# Patient Record
Sex: Female | Born: 1950 | Race: White | Hispanic: No | Marital: Married | Smoking: Never smoker
Health system: Southern US, Community
[De-identification: ages and names within clinical notes are randomized; demographics above are authoritative.]

## PROBLEM LIST (undated history)

## (undated) DIAGNOSIS — C4370 Malignant melanoma of unspecified lower limb, including hip: Secondary | ICD-10-CM

## (undated) DIAGNOSIS — E785 Hyperlipidemia, unspecified: Secondary | ICD-10-CM

## (undated) DIAGNOSIS — I10 Essential (primary) hypertension: Secondary | ICD-10-CM

## (undated) DIAGNOSIS — M1990 Unspecified osteoarthritis, unspecified site: Secondary | ICD-10-CM

## (undated) DIAGNOSIS — K5790 Diverticulosis of intestine, part unspecified, without perforation or abscess without bleeding: Secondary | ICD-10-CM

## (undated) DIAGNOSIS — K5792 Diverticulitis of intestine, part unspecified, without perforation or abscess without bleeding: Secondary | ICD-10-CM

## (undated) HISTORY — PX: OTHER SURGICAL HISTORY: SHX169

## (undated) HISTORY — DX: Diverticulitis of intestine, part unspecified, without perforation or abscess without bleeding: K57.92

## (undated) HISTORY — PX: MELANOMA EXCISION: SHX5266

## (undated) HISTORY — PX: TOOTH EXTRACTION: SUR596

## (undated) HISTORY — DX: Essential (primary) hypertension: I10

## (undated) HISTORY — DX: Hyperlipidemia, unspecified: E78.5

## (undated) HISTORY — DX: Malignant melanoma of unspecified lower limb, including hip: C43.70

## (undated) HISTORY — DX: Diverticulosis of intestine, part unspecified, without perforation or abscess without bleeding: K57.90

---

## 1989-03-04 HISTORY — PX: KNEE ARTHROSCOPY: SUR90

## 1996-07-04 DIAGNOSIS — C4370 Malignant melanoma of unspecified lower limb, including hip: Secondary | ICD-10-CM

## 1996-07-04 HISTORY — DX: Malignant melanoma of unspecified lower limb, including hip: C43.70

## 2011-12-21 LAB — HM COLONOSCOPY

## 2013-03-27 LAB — HM DEXA SCAN

## 2014-03-13 LAB — HM PAP SMEAR: HM Pap smear: NORMAL

## 2014-03-13 LAB — HM MAMMOGRAPHY: HM Mammogram: NORMAL

## 2014-08-08 ENCOUNTER — Encounter: Payer: Self-pay | Admitting: Internal Medicine

## 2014-08-08 ENCOUNTER — Ambulatory Visit (INDEPENDENT_AMBULATORY_CARE_PROVIDER_SITE_OTHER): Payer: BLUE CROSS/BLUE SHIELD | Admitting: Internal Medicine

## 2014-08-08 ENCOUNTER — Encounter (INDEPENDENT_AMBULATORY_CARE_PROVIDER_SITE_OTHER): Payer: Self-pay

## 2014-08-08 VITALS — BP 138/82 | HR 71 | Temp 97.5°F | Ht 65.5 in | Wt 233.5 lb

## 2014-08-08 DIAGNOSIS — K5790 Diverticulosis of intestine, part unspecified, without perforation or abscess without bleeding: Secondary | ICD-10-CM | POA: Insufficient documentation

## 2014-08-08 DIAGNOSIS — K5730 Diverticulosis of large intestine without perforation or abscess without bleeding: Secondary | ICD-10-CM

## 2014-08-08 DIAGNOSIS — E785 Hyperlipidemia, unspecified: Secondary | ICD-10-CM

## 2014-08-08 DIAGNOSIS — Z85820 Personal history of malignant melanoma of skin: Secondary | ICD-10-CM | POA: Insufficient documentation

## 2014-08-08 DIAGNOSIS — C4371 Malignant melanoma of right lower limb, including hip: Secondary | ICD-10-CM

## 2014-08-08 DIAGNOSIS — I10 Essential (primary) hypertension: Secondary | ICD-10-CM

## 2014-08-08 DIAGNOSIS — Z0000 Encounter for general adult medical examination without abnormal findings: Secondary | ICD-10-CM | POA: Insufficient documentation

## 2014-08-08 LAB — COMPREHENSIVE METABOLIC PANEL
ALT: 20 U/L (ref 0–35)
AST: 30 U/L (ref 0–37)
Albumin: 4 g/dL (ref 3.5–5.2)
Alkaline Phosphatase: 76 U/L (ref 39–117)
BILIRUBIN TOTAL: 0.5 mg/dL (ref 0.2–1.2)
BUN: 12 mg/dL (ref 6–23)
CHLORIDE: 102 meq/L (ref 96–112)
CO2: 30 meq/L (ref 19–32)
CREATININE: 0.78 mg/dL (ref 0.40–1.20)
Calcium: 10.1 mg/dL (ref 8.4–10.5)
GFR: 79.45 mL/min (ref >60.00)
Glucose, Bld: 98 mg/dL (ref 70–99)
Potassium: 4.2 mEq/L (ref 3.5–5.1)
Sodium: 138 mEq/L (ref 135–145)
Total Protein: 7.3 g/dL (ref 6.0–8.3)

## 2014-08-08 LAB — CBC WITH DIFFERENTIAL/PLATELET
BASOS ABS: 0.1 10*3/uL (ref 0.0–0.1)
Basophils Relative: 1.1 % (ref 0.0–3.0)
Eosinophils Absolute: 0.4 10*3/uL (ref 0.0–0.7)
Eosinophils Relative: 5.7 % — ABNORMAL HIGH (ref 0.0–5.0)
HCT: 41 % (ref 36.0–46.0)
HEMOGLOBIN: 13.7 g/dL (ref 12.0–15.0)
LYMPHS ABS: 2.1 10*3/uL (ref 0.7–4.0)
Lymphocytes Relative: 31 % (ref 12.0–46.0)
MCHC: 33.3 g/dL (ref 30.0–36.0)
MCV: 88.6 fl (ref 78.0–100.0)
MONO ABS: 0.5 10*3/uL (ref 0.1–1.0)
Monocytes Relative: 7.4 % (ref 3.0–12.0)
Neutro Abs: 3.8 10*3/uL (ref 1.4–7.7)
Neutrophils Relative %: 54.8 % (ref 43.0–77.0)
Platelets: 250 10*3/uL (ref 150.0–400.0)
RBC: 4.63 Mil/uL (ref 3.87–5.11)
RDW: 14.4 % (ref 11.5–15.5)
WBC: 6.9 10*3/uL (ref 4.0–10.5)

## 2014-08-08 LAB — LIPID PANEL
Cholesterol: 177 mg/dL (ref 0–200)
HDL: 52.4 mg/dL (ref >39.00)
LDL CALC: 104 mg/dL — AB (ref 0–99)
NONHDL: 124.6
Total CHOL/HDL Ratio: 3
Triglycerides: 105 mg/dL (ref 0.0–149.0)
VLDL: 21 mg/dL (ref 0.0–40.0)

## 2014-08-08 LAB — T4, FREE: FREE T4: 0.95 ng/dL (ref 0.60–1.60)

## 2014-08-08 MED ORDER — PRAVASTATIN SODIUM 10 MG PO TABS: 10.0000 mg | ORAL_TABLET | Freq: Every day | ORAL | Status: DC

## 2014-08-08 MED ORDER — HYDROCHLOROTHIAZIDE 12.5 MG PO CAPS: 12.5000 mg | ORAL_CAPSULE | Freq: Every day | ORAL | Status: DC

## 2014-08-08 NOTE — Assessment & Plan Note (Signed)
Discussed primary prevention Will continue statin

## 2014-08-08 NOTE — Assessment & Plan Note (Signed)
Discussed improved bowel regimen Will try miralax

## 2014-08-08 NOTE — Patient Instructions (Addendum)
Try daily miralax for your bowels. If you need more, you can add senna-s 2 tabs daily Stop either the krill oil or fish oil You can take just one multivitamin instead of all the vitamins you take.  DASH Eating Plan DASH stands for "Dietary Approaches to Stop Hypertension." The DASH eating plan is a healthy eating plan that has been shown to reduce high blood pressure (hypertension). Additional health benefits may include reducing the risk of type 2 diabetes mellitus, heart disease, and stroke. The DASH eating plan may also help with weight loss. WHAT DO I NEED TO KNOW ABOUT THE DASH EATING PLAN? For the DASH eating plan, you will follow these general guidelines:  Choose foods with a percent daily value for sodium of less than 5% (as listed on the food label).  Use salt-free seasonings or herbs instead of table salt or sea salt.  Check with your health care provider or pharmacist before using salt substitutes.  Eat lower-sodium products, often labeled as "lower sodium" or "no salt added."  Eat fresh foods.  Eat more vegetables, fruits, and low-fat dairy products.  Choose whole grains. Look for the word "whole" as the first word in the ingredient list.  Choose fish and skinless chicken or [place] more often than red meat. Limit fish, poultry, and meat to 6 oz (170 g) each day.  Limit sweets, desserts, sugars, and sugary drinks.  Choose heart-healthy fats.  Limit cheese to 1 oz (28 g) per day.  Eat more home-cooked food and less restaurant, buffet, and fast food.  Limit fried foods.  Cook foods using methods other than frying.  Limit canned vegetables. If you do use them, rinse them well to decrease the sodium.  When eating at a restaurant, ask that your food be prepared with less salt, or no salt if possible. WHAT FOODS CAN I EAT? Seek help from a dietitian for individual calorie needs. Grains Whole grain or whole wheat bread. Brown rice. Whole grain or whole wheat pasta.  Quinoa, bulgur, and whole grain cereals. Low-sodium cereals. Corn or whole wheat flour tortillas. Whole grain cornbread. Whole grain crackers. Low-sodium crackers. Vegetables Fresh or frozen vegetables (raw, steamed, roasted, or grilled). Low-sodium or reduced-sodium tomato and vegetable juices. Low-sodium or reduced-sodium tomato sauce and paste. Low-sodium or reduced-sodium canned vegetables.  Fruits All fresh, canned (in natural juice), or frozen fruits. Meat and Other Protein Products Ground beef (85% or leaner), grass-fed beef, or beef trimmed of fat. Skinless chicken or [place]. Ground chicken or [place]. Pork trimmed of fat. All fish and seafood. Eggs. Dried beans, peas, or lentils. Unsalted nuts and seeds. Unsalted canned beans. Dairy Low-fat dairy products, such as skim or 1% milk, 2% or reduced-fat cheeses, low-fat ricotta or cottage cheese, or plain low-fat yogurt. Low-sodium or reduced-sodium cheeses. Fats and Oils Tub margarines without trans fats. Light or reduced-fat mayonnaise and salad dressings (reduced sodium). Avocado. Safflower, olive, or canola oils. Natural peanut or almond butter. Other Unsalted popcorn and pretzels. The items listed above may not be a complete list of recommended foods or beverages. Contact your dietitian for more options. WHAT FOODS ARE NOT RECOMMENDED? Grains White bread. White pasta. White rice. Refined cornbread. Bagels and croissants. Crackers that contain trans fat. Vegetables Creamed or fried vegetables. Vegetables in a cheese sauce. Regular canned vegetables. Regular canned tomato sauce and paste. Regular tomato and vegetable juices. Fruits Dried fruits. Canned fruit in light or heavy syrup. Fruit juice. Meat and Other Protein Products Fatty cuts of meat.  Ribs, chicken wings, bacon, sausage, bologna, salami, chitterlings, fatback, hot dogs, bratwurst, and packaged luncheon meats. Salted nuts and seeds. Canned beans with salt. Dairy Whole or 2%  milk, cream, half-and-half, and cream cheese. Whole-fat or sweetened yogurt. Full-fat cheeses or blue cheese. Nondairy creamers and whipped toppings. Processed cheese, cheese spreads, or cheese curds. Condiments Onion and garlic salt, seasoned salt, table salt, and sea salt. Canned and packaged gravies. Worcestershire sauce. Tartar sauce. Barbecue sauce. Teriyaki sauce. Soy sauce, including reduced sodium. Steak sauce. Fish sauce. Oyster sauce. Cocktail sauce. Horseradish. Ketchup and mustard. Meat flavorings and tenderizers. Bouillon cubes. Hot sauce. Tabasco sauce. Marinades. Taco seasonings. Relishes. Fats and Oils Butter, stick margarine, lard, shortening, ghee, and bacon fat. Coconut, palm kernel, or palm oils. Regular salad dressings. Other Pickles and olives. Salted popcorn and pretzels. The items listed above may not be a complete list of foods and beverages to avoid. Contact your dietitian for more information. WHERE CAN I FIND MORE INFORMATION? National Heart, Lung, and Blood Institute: travelstabloid.com Document Released: 06/09/2011 Document Revised: 11/04/2013 Document Reviewed: 04/24/2013 [facility] Patient Information 2015 [place], [place]. This information is not intended to replace advice given to you by your health care provider. Make sure you discuss any questions you have with your health care provider. Exercise to Lose Weight Exercise and a healthy diet may help you lose weight. Your doctor may suggest specific exercises. EXERCISE IDEAS AND TIPS  Choose low-cost things you enjoy doing, such as walking, bicycling, or exercising to workout videos.  Take stairs instead of the elevator.  Walk during your lunch break.  Park your car further away from work or school.  Go to a gym or an exercise class.  Start with 5 to 10 minutes of exercise each day. Build up to 30 minutes of exercise 4 to 6 days a week.  Wear shoes with good support and  comfortable clothes.  Stretch before and after working out.  Work out until you breathe harder and your heart beats faster.  Drink extra water when you exercise.  Do not do so much that you hurt yourself, feel dizzy, or get very short of breath. Exercises that burn about 150 calories:  Running 1  miles in 15 minutes.  Playing volleyball for 45 to 60 minutes.  Washing and waxing a car for 45 to 60 minutes.  Playing touch football for 45 minutes.  Walking 1  miles in 35 minutes.  Pushing a stroller 1  miles in 30 minutes.  Playing basketball for 30 minutes.  Raking leaves for 30 minutes.  Bicycling 5 miles in 30 minutes.  Walking 2 miles in 30 minutes.  Dancing for 30 minutes.  Shoveling snow for 15 minutes.  Swimming laps for 20 minutes.  Walking up stairs for 15 minutes.  Bicycling 4 miles in 15 minutes.  Gardening for 30 to 45 minutes.  Jumping rope for 15 minutes.  Washing windows or floors for 45 to 60 minutes. Document Released: 07/23/2010 Document Revised: 09/12/2011 Document Reviewed: 07/23/2010 [facility] Patient Information 2015 [place], [place]. This information is not intended to replace advice given to you by your health care provider. Make sure you discuss any questions you have with your health care provider.

## 2014-08-08 NOTE — Progress Notes (Signed)
Pre visit review using our clinic review tool, if applicable. No additional management support is needed unless otherwise documented below in the visit note. 

## 2014-08-08 NOTE — Assessment & Plan Note (Signed)
UTD with imms and cancer screening

## 2014-08-08 NOTE — Addendum Note (Signed)
Addended by: Viviana Simpler I on: 08/08/2014 10:58 AM   Modules accepted: Orders

## 2014-08-08 NOTE — Assessment & Plan Note (Signed)
BP Readings from Last 3 Encounters:  08/08/14 138/82   Seems to be controlled Will continue the med

## 2014-08-08 NOTE — Progress Notes (Signed)
Subjective:    Patient ID: Cindy Mendoza, female    DOB: [birth year], 64 y.o.   MRN: [id number]  HPI Here with husband to establish Changing due to insurance  Blood pressure up some over the past 5-6 years On HCTZ since then No headaches, chest pain, SOB Doesn't do any exercise  On statin No problems with that No myalgias on this one---had trouble with simvastatin  Chronic constipation Uses stool softener but not very effective  No current outpatient prescriptions on file prior to visit.   No current facility-administered medications on file prior to visit.    No Known Allergies  Past Medical History  Diagnosis Date  . Diverticulitis   . Melanoma of foot 1998  . Hyperlipidemia   . Hypertension     Past Surgical History  Procedure Laterality Date  . Tooth extraction    . Melanoma excision    . Melanoma removal    . Knee arthroscopy Right 1990's    Family History  Problem Relation Age of Onset  . Arthritis Mother   . Cancer Father     lung cancer  . Heart disease Sister     History   Social History  . Marital Status: Married    Spouse Name: N/A    Number of Children: 1  . Years of Education: N/A   Occupational History  . 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 Smoking status: Never Smoker   . Smokeless tobacco: Never Used  . Alcohol Use: No  . Drug Use: No  . Sexual Activity: Yes   Other Topics Concern  . Not on file   Social History Narrative   1 daughter   Review of Systems  Constitutional: Negative for fatigue and unexpected weight change.       No exercise Wears seat belt  HENT: Positive for dental problem. Negative for hearing loss and tinnitus.        Mild TMJ symptoms at times Regular with dentist  Eyes: Negative for visual disturbance.  Respiratory: Negative for cough, chest tightness and shortness of breath.   Cardiovascular: Positive for leg swelling. Negative  for chest pain and palpitations.       Slight swelling if up a long time  Gastrointestinal: Positive for abdominal pain. Negative for nausea, vomiting, constipation and blood in stool.       Some diverticulosis pain  Endocrine: Negative for polydipsia and polyuria.  Genitourinary: Negative for difficulty urinating and dyspareunia.       UTIs in past   Musculoskeletal: Negative for back pain, joint swelling and arthralgias.  Skin: Negative for rash.  Allergic/Immunologic: Negative for environmental allergies and immunocompromised state.  Neurological: Negative for dizziness, syncope, weakness, light-headedness, numbness and headaches.  Psychiatric/Behavioral: Negative for sleep disturbance and dysphoric mood. The patient is not nervous/anxious.        Snores but no apnea       Objective:   Physical Exam  Constitutional: She is oriented to person, place, and time. She appears well-developed and well-nourished. No distress.  HENT:  Head: Normocephalic and atraumatic.  Right Ear: External ear normal.  Left Ear: External ear normal.  Mouth/Throat: Oropharynx is clear and moist. No oropharyngeal exudate.  Eyes: Conjunctivae and EOM are normal. Pupils are equal, round, and reactive to light.  Neck: Normal range of motion. Neck supple. No thyromegaly present.  Cardiovascular: Normal rate, regular rhythm, normal heart sounds  and intact distal pulses.  Exam reveals no gallop.   No murmur heard. Pulmonary/Chest: Effort normal and breath sounds normal. No respiratory distress. She has no wheezes. She has no rales.  Abdominal: Soft. There is no tenderness.  Musculoskeletal: She exhibits no edema or tenderness.  Lymphadenopathy:    She has no cervical adenopathy.  Neurological: She is alert and oriented to person, place, and time.  Skin: No rash noted. No erythema.  Psychiatric: She has a normal mood and affect. Her behavior is normal.          Assessment & Plan:

## 2014-08-08 NOTE — Assessment & Plan Note (Signed)
Keeps up with yearly derm evaluations

## 2014-08-12 ENCOUNTER — Telehealth: Payer: Self-pay | Admitting: Internal Medicine

## 2014-08-12 NOTE — Telephone Encounter (Signed)
emmi emailed °

## 2015-09-10 ENCOUNTER — Ambulatory Visit (INDEPENDENT_AMBULATORY_CARE_PROVIDER_SITE_OTHER): Payer: BLUE CROSS/BLUE SHIELD | Admitting: Internal Medicine

## 2015-09-10 ENCOUNTER — Encounter: Payer: Self-pay | Admitting: Internal Medicine

## 2015-09-10 VITALS — BP 118/90 | HR 84 | Temp 98.0°F | Ht 66.0 in | Wt 236.5 lb

## 2015-09-10 DIAGNOSIS — E785 Hyperlipidemia, unspecified: Secondary | ICD-10-CM

## 2015-09-10 DIAGNOSIS — K5730 Diverticulosis of large intestine without perforation or abscess without bleeding: Secondary | ICD-10-CM

## 2015-09-10 DIAGNOSIS — Z0000 Encounter for general adult medical examination without abnormal findings: Secondary | ICD-10-CM | POA: Diagnosis not present

## 2015-09-10 DIAGNOSIS — I10 Essential (primary) hypertension: Secondary | ICD-10-CM | POA: Diagnosis not present

## 2015-09-10 LAB — CBC WITH DIFFERENTIAL/PLATELET
BASOS ABS: 0.1 10*3/uL (ref 0.0–0.1)
Basophils Relative: 0.8 % (ref 0.0–3.0)
EOS ABS: 0.4 10*3/uL (ref 0.0–0.7)
EOS PCT: 5.1 % — AB (ref 0.0–5.0)
HCT: 41.2 % (ref 36.0–46.0)
HEMOGLOBIN: 13.8 g/dL (ref 12.0–15.0)
LYMPHS ABS: 2.7 10*3/uL (ref 0.7–4.0)
Lymphocytes Relative: 32.4 % (ref 12.0–46.0)
MCHC: 33.5 g/dL (ref 30.0–36.0)
MCV: 87.8 fl (ref 78.0–100.0)
MONO ABS: 0.6 10*3/uL (ref 0.1–1.0)
Monocytes Relative: 6.8 % (ref 3.0–12.0)
NEUTROS PCT: 54.9 % (ref 43.0–77.0)
Neutro Abs: 4.5 10*3/uL (ref 1.4–7.7)
Platelets: 262 10*3/uL (ref 150.0–400.0)
RBC: 4.69 Mil/uL (ref 3.87–5.11)
RDW: 14.7 % (ref 11.5–15.5)
WBC: 8.2 10*3/uL (ref 4.0–10.5)

## 2015-09-10 LAB — COMPREHENSIVE METABOLIC PANEL
ALBUMIN: 4.3 g/dL (ref 3.5–5.2)
ALK PHOS: 73 U/L (ref 39–117)
ALT: 24 U/L (ref 0–35)
AST: 35 U/L (ref 0–37)
BILIRUBIN TOTAL: 0.7 mg/dL (ref 0.2–1.2)
BUN: 11 mg/dL (ref 6–23)
CO2: 30 mEq/L (ref 19–32)
CREATININE: 0.81 mg/dL (ref 0.40–1.20)
Calcium: 9.8 mg/dL (ref 8.4–10.5)
Chloride: 100 mEq/L (ref 96–112)
GFR: 75.8 mL/min (ref >60.00)
GLUCOSE: 95 mg/dL (ref 70–99)
POTASSIUM: 3.6 meq/L (ref 3.5–5.1)
SODIUM: 139 meq/L (ref 135–145)
TOTAL PROTEIN: 7.9 g/dL (ref 6.0–8.3)

## 2015-09-10 LAB — LIPID PANEL
CHOLESTEROL: 183 mg/dL (ref 0–200)
HDL: 54.4 mg/dL (ref >39.00)
LDL CALC: 110 mg/dL — AB (ref 0–99)
NonHDL: 128.35
TRIGLYCERIDES: 93 mg/dL (ref 0.0–149.0)
Total CHOL/HDL Ratio: 3
VLDL: 18.6 mg/dL (ref 0.0–40.0)

## 2015-09-10 MED ORDER — PRAVASTATIN SODIUM 10 MG PO TABS: 10.0000 mg | ORAL_TABLET | Freq: Every day | ORAL | Status: DC

## 2015-09-10 MED ORDER — AMOXICILLIN-POT CLAVULANATE 875-125 MG PO TABS: 1.0000 | ORAL_TABLET | Freq: Two times a day (BID) | ORAL | Status: DC

## 2015-09-10 MED ORDER — HYDROCHLOROTHIAZIDE 12.5 MG PO CAPS: 12.5000 mg | ORAL_CAPSULE | Freq: Every day | ORAL | Status: DC

## 2015-09-10 NOTE — Assessment & Plan Note (Signed)
Recurrent spells of diverticulitis Will give Rx for augmentin to use if recurs

## 2015-09-10 NOTE — Progress Notes (Signed)
Pre visit review using our clinic review tool, if applicable. No additional management support is needed unless otherwise documented below in the visit note. 

## 2015-09-10 NOTE — Assessment & Plan Note (Signed)
BP Readings from Last 3 Encounters:  09/10/15 118/90  08/08/14 138/82   Good control No change needed

## 2015-09-10 NOTE — Assessment & Plan Note (Signed)
Doing well on primary prevention Will continue

## 2015-09-10 NOTE — Patient Instructions (Addendum)
Please set up your screening mammogram this September  DASH Eating Plan DASH stands for "Dietary Approaches to Stop Hypertension." The DASH eating plan is a healthy eating plan that has been shown to reduce high blood pressure (hypertension). Additional health benefits may include reducing the risk of type 2 diabetes mellitus, heart disease, and stroke. The DASH eating plan may also help with weight loss. WHAT DO I NEED TO KNOW ABOUT THE DASH EATING PLAN? For the DASH eating plan, you will follow these general guidelines:  Choose foods with a percent daily value for sodium of less than 5% (as listed on the food label).  Use salt-free seasonings or herbs instead of table salt or sea salt.  Check with your health care provider or pharmacist before using salt substitutes.  Eat lower-sodium products, often labeled as "lower sodium" or "no salt added."  Eat fresh foods.  Eat more vegetables, fruits, and low-fat dairy products.  Choose whole grains. Look for the word "whole" as the first word in the ingredient list.  Choose fish and skinless chicken or [place] more often than red meat. Limit fish, poultry, and meat to 6 oz (170 g) each day.  Limit sweets, desserts, sugars, and sugary drinks.  Choose heart-healthy fats.  Limit cheese to 1 oz (28 g) per day.  Eat more home-cooked food and less restaurant, buffet, and fast food.  Limit fried foods.  Cook foods using methods other than frying.  Limit canned vegetables. If you do use them, rinse them well to decrease the sodium.  When eating at a restaurant, ask that your food be prepared with less salt, or no salt if possible. WHAT FOODS CAN I EAT? Seek help from a dietitian for individual calorie needs. Grains Whole grain or whole wheat bread. Brown rice. Whole grain or whole wheat pasta. Quinoa, bulgur, and whole grain cereals. Low-sodium cereals. Corn or whole wheat flour tortillas. Whole grain cornbread. Whole grain crackers.  Low-sodium crackers. Vegetables Fresh or frozen vegetables (raw, steamed, roasted, or grilled). Low-sodium or reduced-sodium tomato and vegetable juices. Low-sodium or reduced-sodium tomato sauce and paste. Low-sodium or reduced-sodium canned vegetables.  Fruits All fresh, canned (in natural juice), or frozen fruits. Meat and Other Protein Products Ground beef (85% or leaner), grass-fed beef, or beef trimmed of fat. Skinless chicken or [place]. Ground chicken or [place]. Pork trimmed of fat. All fish and seafood. Eggs. Dried beans, peas, or lentils. Unsalted nuts and seeds. Unsalted canned beans. Dairy Low-fat dairy products, such as skim or 1% milk, 2% or reduced-fat cheeses, low-fat ricotta or cottage cheese, or plain low-fat yogurt. Low-sodium or reduced-sodium cheeses. Fats and Oils Tub margarines without trans fats. Light or reduced-fat mayonnaise and salad dressings (reduced sodium). Avocado. Safflower, olive, or canola oils. Natural peanut or almond butter. Other Unsalted popcorn and pretzels. The items listed above may not be a complete list of recommended foods or beverages. Contact your dietitian for more options. WHAT FOODS ARE NOT RECOMMENDED? Grains White bread. White pasta. White rice. Refined cornbread. Bagels and croissants. Crackers that contain trans fat. Vegetables Creamed or fried vegetables. Vegetables in a cheese sauce. Regular canned vegetables. Regular canned tomato sauce and paste. Regular tomato and vegetable juices. Fruits Dried fruits. Canned fruit in light or heavy syrup. Fruit juice. Meat and Other Protein Products Fatty cuts of meat. Ribs, chicken wings, bacon, sausage, bologna, salami, chitterlings, fatback, hot dogs, bratwurst, and packaged luncheon meats. Salted nuts and seeds. Canned beans with salt. Dairy Whole or 2% milk, cream,  half-and-half, and cream cheese. Whole-fat or sweetened yogurt. Full-fat cheeses or blue cheese. Nondairy creamers and whipped  toppings. Processed cheese, cheese spreads, or cheese curds. Condiments Onion and garlic salt, seasoned salt, table salt, and sea salt. Canned and packaged gravies. Worcestershire sauce. Tartar sauce. Barbecue sauce. Teriyaki sauce. Soy sauce, including reduced sodium. Steak sauce. Fish sauce. Oyster sauce. Cocktail sauce. Horseradish. Ketchup and mustard. Meat flavorings and tenderizers. Bouillon cubes. Hot sauce. Tabasco sauce. Marinades. Taco seasonings. Relishes. Fats and Oils Butter, stick margarine, lard, shortening, ghee, and bacon fat. Coconut, palm kernel, or palm oils. Regular salad dressings. Other Pickles and olives. Salted popcorn and pretzels. The items listed above may not be a complete list of foods and beverages to avoid. Contact your dietitian for more information. WHERE CAN I FIND MORE INFORMATION? National Heart, Lung, and Blood Institute: travelstabloid.com   This information is not intended to replace advice given to you by your health care provider. Make sure you discuss any questions you have with your health care provider.   Document Released: 06/09/2011 Document Revised: 07/11/2014 Document Reviewed: 04/24/2013 Elsevier Interactive Patient Education [employer].

## 2015-09-10 NOTE — Assessment & Plan Note (Addendum)
Healthy but needs to work on fitness mammo due 9/17 One more pap due by age 65--she may go to gyn UTD on imms Colon due 2023

## 2015-09-10 NOTE — Progress Notes (Signed)
Subjective:    Patient ID: Cindy Mendoza, female    DOB: 11/25/1950, 65 y.o.   MRN: [id number]  HPI Here for physical  Bad cold in January--then late January had another spell with diverticulitis Had left over medication--cipro/flagyl (a few doses) She did recover with soft diet Usually goes regularly with metamucil Wonders about keeping safety medication--just in case  No other concerns Not doing much for healthy lifestyle--counseled  Current Outpatient Prescriptions on File Prior to Visit  Medication Sig Dispense Refill  . Ascorbic Acid (VITAMIN C PO) Take 1 capsule by mouth.    . Calcium Carbonate-Vit D-Min (CALCIUM 1200 PO) Take 1 tablet by mouth daily.    . Flaxseed, Linseed, (FLAX SEED OIL) 1000 MG CAPS Take 1 capsule by mouth daily.    . hydrochlorothiazide (MICROZIDE) 12.5 MG capsule Take 1 capsule (12.5 mg total) by mouth daily. 90 capsule 3  . Omega-3 Fatty Acids (FISH OIL) 1000 MG CAPS Take 2 capsules by mouth daily.    . pravastatin (PRAVACHOL) 10 MG tablet Take 1 tablet (10 mg total) by mouth daily. 90 tablet 3  . Vitamin D, Cholecalciferol, 1000 UNITS CAPS Take 1 capsule by mouth daily.     No current facility-administered medications on file prior to visit.    Allergies  Allergen Reactions  . Simvastatin Other (See Comments)    myalgias    Past Medical History  Diagnosis Date  . Diverticulitis   . Melanoma of foot ([place]) 1998  . Hyperlipidemia   . Hypertension   . Diverticulosis     Past Surgical History  Procedure Laterality Date  . Tooth extraction    . Melanoma excision    . Melanoma removal    . Knee arthroscopy Right 1990's    Family History  Problem Relation Age of Onset  . Arthritis Mother   . Cancer Father     lung cancer  . Heart disease Sister     Social History   Social History  . Marital Status: Married    Spouse Name: N/A  . Number of Children: 1  . Years of Education: N/A   Occupational History  . 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 Smoking status: Never Smoker   . Smokeless tobacco: Never Used  . Alcohol Use: No  . Drug Use: No  . Sexual Activity: Yes   Other Topics Concern  . Not on file   Social History Narrative   1 daughter   Review of Systems  Constitutional: Negative for fatigue and unexpected weight change.       Wears seat belt  HENT: Negative for dental problem, hearing loss and tinnitus.        Keeps up with dentist Did have swelling at left mandible--better on its own   Eyes: Negative for visual disturbance.       Did have flashing lights in right eye---was seen and not retinal detachment  Respiratory: Negative for cough, chest tightness and shortness of breath.   Cardiovascular: Negative for chest pain and palpitations.       Slight edema if on feet all day  Gastrointestinal: Negative for nausea, vomiting, abdominal pain and blood in stool.       No heartburn  Endocrine: Negative for polydipsia and polyuria.  Genitourinary: Negative for dysuria, hematuria and dyspareunia.       Gyn just retired---deciding about seeing someone else  Musculoskeletal:  Negative for back pain, joint swelling and arthralgias.       AM stiffness that resolves quickly  Skin: Negative for rash.       Sees derm yearly  Allergic/Immunologic: Negative for environmental allergies and immunocompromised state.  Neurological: Negative for dizziness, syncope, weakness, light-headedness and headaches.  Hematological: Negative for adenopathy. Does not bruise/bleed easily.  Psychiatric/Behavioral: Negative for sleep disturbance and dysphoric mood. The patient is not nervous/anxious.        Objective:   Physical Exam  Constitutional: She is oriented to person, place, and time. She appears well-developed and well-nourished. No distress.  HENT:  Head: Normocephalic and atraumatic.  Right Ear: External ear normal.  Left Ear: External ear  normal.  Mouth/Throat: Oropharynx is clear and moist. No oropharyngeal exudate.  Eyes: Conjunctivae are normal. Pupils are equal, round, and reactive to light.  Neck: Normal range of motion. Neck supple. No thyromegaly present.  Cardiovascular: Normal rate, regular rhythm, normal heart sounds and intact distal pulses.  Exam reveals no gallop.   No murmur heard. Pulmonary/Chest: Effort normal and breath sounds normal. No respiratory distress. She has no wheezes. She has no rales.  Abdominal: Soft. There is no tenderness.  Musculoskeletal: She exhibits no edema or tenderness.  Lymphadenopathy:    She has no cervical adenopathy.  Neurological: She is alert and oriented to person, place, and time.  Skin: No rash noted. No erythema.  Psychiatric: She has a normal mood and affect. Her behavior is normal.          Assessment & Plan:

## 2016-07-15 ENCOUNTER — Telehealth: Payer: Self-pay | Admitting: Internal Medicine

## 2016-07-15 NOTE — Telephone Encounter (Signed)
Pt called to cancel her 2018 cpe. Her ins does not cover preventative this year but she will call when she gets medicare later this year.

## 2016-09-13 ENCOUNTER — Encounter: Payer: BLUE CROSS/BLUE SHIELD | Admitting: Internal Medicine

## 2017-04-19 DIAGNOSIS — L578 Other skin changes due to chronic exposure to nonionizing radiation: Secondary | ICD-10-CM | POA: Diagnosis not present

## 2017-04-19 DIAGNOSIS — Z85820 Personal history of malignant melanoma of skin: Secondary | ICD-10-CM | POA: Diagnosis not present

## 2017-04-19 DIAGNOSIS — Z86018 Personal history of other benign neoplasm: Secondary | ICD-10-CM | POA: Diagnosis not present

## 2017-04-19 DIAGNOSIS — L570 Actinic keratosis: Secondary | ICD-10-CM | POA: Diagnosis not present

## 2017-04-19 DIAGNOSIS — L821 Other seborrheic keratosis: Secondary | ICD-10-CM | POA: Diagnosis not present

## 2017-04-24 DIAGNOSIS — H2510 Age-related nuclear cataract, unspecified eye: Secondary | ICD-10-CM | POA: Diagnosis not present

## 2017-04-24 DIAGNOSIS — H43819 Vitreous degeneration, unspecified eye: Secondary | ICD-10-CM | POA: Diagnosis not present

## 2017-04-24 DIAGNOSIS — Z23 Encounter for immunization: Secondary | ICD-10-CM | POA: Diagnosis not present

## 2017-04-27 DIAGNOSIS — Z1231 Encounter for screening mammogram for malignant neoplasm of breast: Secondary | ICD-10-CM | POA: Diagnosis not present

## 2017-04-27 LAB — HM MAMMOGRAPHY

## 2017-05-15 DIAGNOSIS — Z1211 Encounter for screening for malignant neoplasm of colon: Secondary | ICD-10-CM | POA: Diagnosis not present

## 2017-05-15 DIAGNOSIS — K5730 Diverticulosis of large intestine without perforation or abscess without bleeding: Secondary | ICD-10-CM | POA: Diagnosis not present

## 2017-05-15 LAB — HM COLONOSCOPY

## 2017-09-21 ENCOUNTER — Encounter: Payer: Self-pay | Admitting: Internal Medicine

## 2017-09-21 ENCOUNTER — Ambulatory Visit (INDEPENDENT_AMBULATORY_CARE_PROVIDER_SITE_OTHER): Payer: Medicare Other | Admitting: Internal Medicine

## 2017-09-21 VITALS — BP 128/76 | HR 75 | Temp 98.3°F | Ht 66.5 in | Wt 249.5 lb

## 2017-09-21 DIAGNOSIS — E2839 Other primary ovarian failure: Secondary | ICD-10-CM

## 2017-09-21 DIAGNOSIS — Z7189 Other specified counseling: Secondary | ICD-10-CM | POA: Insufficient documentation

## 2017-09-21 DIAGNOSIS — Z23 Encounter for immunization: Secondary | ICD-10-CM

## 2017-09-21 DIAGNOSIS — Z85820 Personal history of malignant melanoma of skin: Secondary | ICD-10-CM | POA: Diagnosis not present

## 2017-09-21 DIAGNOSIS — Z0000 Encounter for general adult medical examination without abnormal findings: Secondary | ICD-10-CM

## 2017-09-21 DIAGNOSIS — I10 Essential (primary) hypertension: Secondary | ICD-10-CM

## 2017-09-21 DIAGNOSIS — E785 Hyperlipidemia, unspecified: Secondary | ICD-10-CM

## 2017-09-21 MED ORDER — PRAVASTATIN SODIUM 10 MG PO TABS: 10.0000 mg | ORAL_TABLET | Freq: Every day | ORAL | 3 refills | Status: DC

## 2017-09-21 MED ORDER — PRAVASTATIN SODIUM 10 MG PO TABS: 10.0000 mg | ORAL_TABLET | Freq: Every day | ORAL | 3 refills | Status: AC

## 2017-09-21 MED ORDER — HYDROCHLOROTHIAZIDE 12.5 MG PO TABS: 12.5000 mg | ORAL_TABLET | Freq: Every day | ORAL | 3 refills | Status: AC

## 2017-09-21 NOTE — Assessment & Plan Note (Signed)
See social history 

## 2017-09-21 NOTE — Progress Notes (Signed)
Hearing Screening   125Hz  250Hz  500Hz  1000Hz  2000Hz  3000Hz  4000Hz  6000Hz  8000Hz   Right ear:   40 0 40  40    Left ear:   40 40 40  40    Vision Screening Comments: 04/24/17 with Dr. Marvel Plan

## 2017-09-21 NOTE — Assessment & Plan Note (Signed)
BP Readings from Last 3 Encounters:  09/21/17 128/76  09/10/15 118/90  08/08/14 138/82   Good control Due for labs

## 2017-09-21 NOTE — Assessment & Plan Note (Signed)
BMI ~74 plus metabolic syndrome DASH info Urged her to start [employer]

## 2017-09-21 NOTE — Addendum Note (Signed)
Addended by: Tammi Sou on: 09/21/2017 04:37 PM   Modules accepted: Orders

## 2017-09-21 NOTE — Assessment & Plan Note (Signed)
Keeps up with the dermatologist

## 2017-09-21 NOTE — Assessment & Plan Note (Addendum)
I have personally reviewed the Medicare Annual Wellness questionnaire and have noted  1. The patient's medical and social history  2. Their use of alcohol, tobacco or illicit drugs  3. Their current medications and supplements  4. The patient's functional ability including ADL's, fall risks, home safety risks and hearing or visual              impairment.  5. Diet and physical activities  6. Evidence for depression or mood disorders  The patients weight, height, BMI and visual acuity have been recorded in the chart  I have made referrals, counseling and provided education to the patient based review of the above and I have provided the pt with a written personalized care plan for preventive services.   I have provided you with a copy of your personalized plan for preventive services. Please take the time to review along with your updated medication list.  prevnar today Flu vaccine yearly Just had colon and mammogram Will recheck DEXA---done over 5 years ago Discussed fitness  EKG--- sinus at 72. Normal axis and intervals No ischemic changes and appears normal to me (I don't agree with the computer of old AWMI)

## 2017-09-21 NOTE — Assessment & Plan Note (Signed)
Doing fine with primary prevention 

## 2017-09-21 NOTE — Progress Notes (Signed)
Subjective:    Patient ID: Cindy Mendoza, female    DOB: 17-Dec-1950, 67 y.o.   MRN: [id number]  HPI Here for Welcome to Medicare visit and follow up of chronic health conditions Reviewed form and advanced directives Reviewed other doctors No exercise---takes care of 103 year old grandchildren (twins) No tobacco or alcohol No falls No depression or anhedonia Vision is fine No functional hearing problems Independent with instrumental ADLs No sig memory issues  Continues on HCTZ No chest pain or SOB No dizziness or syncope No edema Non palpitations Some headaches--seems to be sinus  Still on statin for primary prevention No myalgias or GI problems  Current Outpatient Medications on File Prior to Visit  Medication Sig Dispense Refill  . Ascorbic Acid (VITAMIN C PO) Take 1 capsule by mouth.    . Calcium Carbonate-Vit D-Min (CALCIUM 1200 PO) Take 1 tablet by mouth daily.    . Flaxseed, Linseed, (FLAX SEED OIL) 1000 MG CAPS Take 1 capsule by mouth daily.    . hydrochlorothiazide (MICROZIDE) 12.5 MG capsule Take 1 capsule (12.5 mg total) by mouth daily. 90 capsule 3  . Omega-3 Fatty Acids (FISH OIL) 1000 MG CAPS Take 2 capsules by mouth daily.    . pravastatin (PRAVACHOL) 10 MG tablet Take 1 tablet (10 mg total) by mouth daily. 90 tablet 3  . psyllium (METAMUCIL) 58.6 % powder Take 1 packet by mouth daily.    . Vitamin D, Cholecalciferol, 1000 UNITS CAPS Take 1 capsule by mouth daily.     No current facility-administered medications on file prior to visit.     Allergies  Allergen Reactions  . Simvastatin Other (See Comments)    myalgias    Past Medical History:  Diagnosis Date  . Diverticulitis   . Diverticulosis   . Hyperlipidemia   . Hypertension   . Melanoma of foot ([place]) 1998    Past Surgical History:  Procedure Laterality Date  . KNEE ARTHROSCOPY Right 1990's  . MELANOMA EXCISION    . Melanoma removal    . TOOTH EXTRACTION      Family History  Problem  Relation Age of Onset  . Arthritis Mother   . Cancer Father        lung cancer  . Heart disease Sister     Social History   Socioeconomic History  . Marital status: Married    Spouse name: Not on file  . Number of children: 1  . Years of education: Not on file  . Highest education level: Not on file  Occupational History  . 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 Financial resource strain: Not on file  . Food insecurity:    Worry: Not on file    Inability: Not on file  . Transportation needs:    Medical: Not on file    Non-medical: Not on file  Tobacco Use  . Smoking status: Never Smoker  . Smokeless tobacco: Never Used  Substance and Sexual Activity  . Alcohol use: No  . Drug use: No  . Sexual activity: Yes  Lifestyle  . Physical activity:    Days per week: Not on file    Minutes per session: Not on file  . Stress: Not on file  Relationships  . Social connections:    Talks on phone: Not on file    Gets together: Not on file    Attends religious service: Not on file  Active member of club or organization: Not on file    Attends meetings of clubs or organizations: Not on file    Relationship status: Not on file  . Intimate partner violence:    Fear of current or ex partner: Not on file    Emotionally abused: Not on file    Physically abused: Not on file    Forced sexual activity: Not on file  Other Topics Concern  . 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 No blood No urinary problems Mild joint pain--especially with bad weather. Occasional aleve Keeps up with derm--history of  melanoma Occasional heartburn--tums helps. Once a week usually. No dysphagia    Objective:   Physical Exam  Constitutional: She is oriented to person, place, and time. She appears well-developed. No distress.  HENT:  Mouth/Throat: Oropharynx is clear and moist. No oropharyngeal exudate.  Neck: No thyromegaly present.  Cardiovascular: Normal rate, regular rhythm, normal heart sounds and intact distal pulses. Exam reveals no gallop.  No murmur heard. Pulmonary/Chest: Effort normal and breath sounds normal. No respiratory distress. She has no wheezes. She has no rales.  Abdominal: Soft. There is no tenderness.  Musculoskeletal: She exhibits no edema or tenderness.  Lymphadenopathy:    She has no cervical adenopathy.  Neurological: She is alert and oriented to person, place, and time.  President--- "Daisy Floro, Obama, Clinton--?" [phone] [id number] Recall 3/3  Skin: No rash noted. No erythema.  Psychiatric: She has a normal mood and affect. Her behavior is normal.          Assessment & Plan:

## 2017-09-21 NOTE — Patient Instructions (Signed)
DASH Eating Plan DASH stands for "Dietary Approaches to Stop Hypertension." The DASH eating plan is a healthy eating plan that has been shown to reduce high blood pressure (hypertension). It may also reduce your risk for type 2 diabetes, heart disease, and stroke. The DASH eating plan may also help with weight loss. What are tips for following this plan? General guidelines  Avoid eating more than 2,300 mg (milligrams) of salt (sodium) a day. If you have hypertension, you may need to reduce your sodium intake to 1,500 mg a day.  Limit alcohol intake to no more than 1 drink a day for nonpregnant women and 2 drinks a day for men. One drink equals 12 oz of beer, 5 oz of wine, or 1 oz of hard liquor.  Work with your health care provider to maintain a healthy body weight or to lose weight. Ask what an ideal weight is for you.  Get at least 30 minutes of exercise that causes your heart to beat faster (aerobic exercise) most days of the week. Activities may include walking, swimming, or biking.  Work with your health care provider or diet and nutrition specialist (dietitian) to adjust your eating plan to your individual calorie needs. Reading food labels  Check food labels for the amount of sodium per serving. Choose foods with less than 5 percent of the Daily Value of sodium. Generally, foods with less than 300 mg of sodium per serving fit into this eating plan.  To find whole grains, look for the word "whole" as the first word in the ingredient list. Shopping  Buy products labeled as "low-sodium" or "no salt added."  Buy fresh foods. Avoid canned foods and premade or frozen meals. Cooking  Avoid adding salt when cooking. Use salt-free seasonings or herbs instead of table salt or sea salt. Check with your health care provider or pharmacist before using salt substitutes.  Do not fry foods. Cook foods using healthy methods such as baking, boiling, grilling, and broiling instead.  Cook with  heart-healthy oils, such as olive, canola, soybean, or sunflower oil. Meal planning   Eat a balanced diet that includes: ? 5 or more servings of fruits and vegetables each day. At each meal, try to fill half of your plate with fruits and vegetables. ? Up to 6-8 servings of whole grains each day. ? Less than 6 oz of lean meat, poultry, or fish each day. A 3-oz serving of meat is about the same size as a deck of cards. One egg equals 1 oz. ? 2 servings of low-fat dairy each day. ? A serving of nuts, seeds, or beans 5 times each week. ? Heart-healthy fats. Healthy fats called Omega-3 fatty acids are found in foods such as flaxseeds and coldwater fish, like sardines, salmon, and mackerel.  Limit how much you eat of the following: ? Canned or prepackaged foods. ? Food that is high in trans fat, such as fried foods. ? Food that is high in saturated fat, such as fatty meat. ? Sweets, desserts, sugary drinks, and other foods with added sugar. ? Full-fat dairy products.  Do not salt foods before eating.  Try to eat at least 2 vegetarian meals each week.  Eat more home-cooked food and less restaurant, buffet, and fast food.  When eating at a restaurant, ask that your food be prepared with less salt or no salt, if possible. What foods are recommended? The items listed may not be a complete list. Talk with your dietitian about what   dietary choices are best for you. Grains Whole-grain or whole-wheat bread. Whole-grain or whole-wheat pasta. Brown rice. Oatmeal. Quinoa. Bulgur. Whole-grain and low-sodium cereals. Pita bread. Low-fat, low-sodium crackers. Whole-wheat flour tortillas. Vegetables Fresh or frozen vegetables (raw, steamed, roasted, or grilled). Low-sodium or reduced-sodium tomato and vegetable juice. Low-sodium or reduced-sodium tomato sauce and tomato paste. Low-sodium or reduced-sodium canned vegetables. Fruits All fresh, dried, or frozen fruit. Canned fruit in natural juice (without  added sugar). Meat and other protein foods Skinless chicken or turkey. Ground chicken or turkey. Pork with fat trimmed off. Fish and seafood. Egg whites. Dried beans, peas, or lentils. Unsalted nuts, nut butters, and seeds. Unsalted canned beans. Lean cuts of beef with fat trimmed off. Low-sodium, lean deli meat. Dairy Low-fat (1%) or fat-free (skim) milk. Fat-free, low-fat, or reduced-fat cheeses. Nonfat, low-sodium ricotta or cottage cheese. Low-fat or nonfat yogurt. Low-fat, low-sodium cheese. Fats and oils Soft margarine without trans fats. Vegetable oil. Low-fat, reduced-fat, or light mayonnaise and salad dressings (reduced-sodium). Canola, safflower, olive, soybean, and sunflower oils. Avocado. Seasoning and other foods Herbs. Spices. Seasoning mixes without salt. Unsalted popcorn and pretzels. Fat-free sweets. What foods are not recommended? The items listed may not be a complete list. Talk with your dietitian about what dietary choices are best for you. Grains Baked goods made with fat, such as croissants, muffins, or some breads. Dry pasta or rice meal packs. Vegetables Creamed or fried vegetables. Vegetables in a cheese sauce. Regular canned vegetables (not low-sodium or reduced-sodium). Regular canned tomato sauce and paste (not low-sodium or reduced-sodium). Regular tomato and vegetable juice (not low-sodium or reduced-sodium). Pickles. Olives. Fruits Canned fruit in a light or heavy syrup. Fried fruit. Fruit in cream or butter sauce. Meat and other protein foods Fatty cuts of meat. Ribs. Fried meat. Bacon. Sausage. Bologna and other processed lunch meats. Salami. Fatback. Hotdogs. Bratwurst. Salted nuts and seeds. Canned beans with added salt. Canned or smoked fish. Whole eggs or egg yolks. Chicken or turkey with skin. Dairy Whole or 2% milk, cream, and half-and-half. Whole or full-fat cream cheese. Whole-fat or sweetened yogurt. Full-fat cheese. Nondairy creamers. Whipped toppings.  Processed cheese and cheese spreads. Fats and oils Butter. Stick margarine. Lard. Shortening. Ghee. Bacon fat. Tropical oils, such as coconut, palm kernel, or palm oil. Seasoning and other foods Salted popcorn and pretzels. Onion salt, garlic salt, seasoned salt, table salt, and sea salt. Worcestershire sauce. Tartar sauce. Barbecue sauce. Teriyaki sauce. Soy sauce, including reduced-sodium. Steak sauce. Canned and packaged gravies. Fish sauce. Oyster sauce. Cocktail sauce. Horseradish that you find on the shelf. Ketchup. Mustard. Meat flavorings and tenderizers. Bouillon cubes. Hot sauce and Tabasco sauce. Premade or packaged marinades. Premade or packaged taco seasonings. Relishes. Regular salad dressings. Where to find more information:  National Heart, Lung, and Blood Institute: www.nhlbi.nih.gov  American Heart Association: www.heart.org Summary  The DASH eating plan is a healthy eating plan that has been shown to reduce high blood pressure (hypertension). It may also reduce your risk for type 2 diabetes, heart disease, and stroke.  With the DASH eating plan, you should limit salt (sodium) intake to 2,300 mg a day. If you have hypertension, you may need to reduce your sodium intake to 1,500 mg a day.  When on the DASH eating plan, aim to eat more fresh fruits and vegetables, whole grains, lean proteins, low-fat dairy, and heart-healthy fats.  Work with your health care provider or diet and nutrition specialist (dietitian) to adjust your eating plan to your individual   calorie needs. This information is not intended to replace advice given to you by your health care provider. Make sure you discuss any questions you have with your health care provider. Document Released: 06/09/2011 Document Revised: 06/13/2016 Document Reviewed: 06/13/2016 Elsevier Interactive Patient Education  2018 Elsevier Inc.  

## 2017-09-22 LAB — COMPREHENSIVE METABOLIC PANEL
ALBUMIN: 4.1 g/dL (ref 3.5–5.2)
ALK PHOS: 80 U/L (ref 39–117)
ALT: 21 U/L (ref 0–35)
AST: 33 U/L (ref 0–37)
BILIRUBIN TOTAL: 0.7 mg/dL (ref 0.2–1.2)
BUN: 12 mg/dL (ref 6–23)
CO2: 29 mEq/L (ref 19–32)
CREATININE: 0.76 mg/dL (ref 0.40–1.20)
Calcium: 9.8 mg/dL (ref 8.4–10.5)
Chloride: 101 mEq/L (ref 96–112)
GFR: 81.06 mL/min (ref >60.00)
Glucose, Bld: 94 mg/dL (ref 70–99)
POTASSIUM: 4.1 meq/L (ref 3.5–5.1)
SODIUM: 139 meq/L (ref 135–145)
TOTAL PROTEIN: 8 g/dL (ref 6.0–8.3)

## 2017-09-22 LAB — LIPID PANEL
Cholesterol: 183 mg/dL (ref 0–200)
HDL: 57.8 mg/dL (ref >39.00)
LDL Cholesterol: 109 mg/dL — ABNORMAL HIGH (ref 0–99)
NonHDL: 125.37
Total CHOL/HDL Ratio: 3
Triglycerides: 84 mg/dL (ref 0.0–149.0)
VLDL: 16.8 mg/dL (ref 0.0–40.0)

## 2017-09-22 LAB — CBC
HCT: 40.6 % (ref 36.0–46.0)
HEMOGLOBIN: 13.4 g/dL (ref 12.0–15.0)
MCHC: 33.1 g/dL (ref 30.0–36.0)
MCV: 88.5 fl (ref 78.0–100.0)
PLATELETS: 290 10*3/uL (ref 150.0–400.0)
RBC: 4.59 Mil/uL (ref 3.87–5.11)
RDW: 14.8 % (ref 11.5–15.5)
WBC: 8.5 10*3/uL (ref 4.0–10.5)

## 2017-10-19 ENCOUNTER — Other Ambulatory Visit: Payer: Medicare Other

## 2018-04-21 DIAGNOSIS — Z23 Encounter for immunization: Secondary | ICD-10-CM | POA: Diagnosis not present

## 2018-04-25 ENCOUNTER — Encounter: Payer: Self-pay | Admitting: Obstetrics & Gynecology

## 2018-04-25 ENCOUNTER — Other Ambulatory Visit (HOSPITAL_COMMUNITY)
Admission: RE | Admit: 2018-04-25 | Discharge: 2018-04-25 | Disposition: A | Discharge status: Home / Self Care | Payer: Medicare Other | Source: Ambulatory Visit | Attending: Obstetrics & Gynecology | Admitting: Obstetrics & Gynecology

## 2018-04-25 ENCOUNTER — Ambulatory Visit (INDEPENDENT_AMBULATORY_CARE_PROVIDER_SITE_OTHER): Payer: Medicare Other | Admitting: Obstetrics & Gynecology

## 2018-04-25 ENCOUNTER — Other Ambulatory Visit: Payer: Self-pay | Admitting: Obstetrics & Gynecology

## 2018-04-25 VITALS — BP 138/80 | Ht 66.0 in | Wt 254.0 lb

## 2018-04-25 DIAGNOSIS — Z1239 Encounter for other screening for malignant neoplasm of breast: Secondary | ICD-10-CM

## 2018-04-25 DIAGNOSIS — Z124 Encounter for screening for malignant neoplasm of cervix: Secondary | ICD-10-CM | POA: Insufficient documentation

## 2018-04-25 DIAGNOSIS — Z1211 Encounter for screening for malignant neoplasm of colon: Secondary | ICD-10-CM

## 2018-04-25 DIAGNOSIS — Z13820 Encounter for screening for osteoporosis: Secondary | ICD-10-CM

## 2018-04-25 DIAGNOSIS — Z0000 Encounter for general adult medical examination without abnormal findings: Secondary | ICD-10-CM

## 2018-04-25 NOTE — Patient Instructions (Addendum)
PAP every three years Mammogram every year    Call [phone] to schedule at [facility]    Also schedule DEXA bone scan Colonoscopy every 10 years Labs yearly (with PCP)

## 2018-04-25 NOTE — Progress Notes (Signed)
HPI:      Ms. Saadiya Wilfong is a 67 y.o. G1P1001 who LMP was in the past, she presents today for her annual examination.  The patient has no complaints today. The patient is sexually active. Herlast pap: approximate date 2016 and was normal and last mammogram: approximate date 2018 and was normal.  The patient does perform self breast exams.  There is no notable family history of breast or ovarian cancer in her family. The patient is not taking hormone replacement therapy. Patient denies post-menopausal vaginal bleeding.   The patient has regular exercise: yes. The patient denies current symptoms of depression.    GYN Hx: Last Colonoscopy:1 year ago. Normal.  Last DEXA: 5 years ago.    PMHx: Past Medical History:  Diagnosis Date  . Diverticulitis   . Diverticulosis   . Hyperlipidemia   . Hypertension   . Melanoma of foot ([place]) 1998   Past Surgical History:  Procedure Laterality Date  . KNEE ARTHROSCOPY Right 1990's  . MELANOMA EXCISION    . Melanoma removal    . TOOTH EXTRACTION     Family History  Problem Relation Age of Onset  . Arthritis Mother   . Cancer Father        lung cancer  . Heart disease Sister    Social History   Tobacco Use  . Smoking status: Never Smoker  . Smokeless tobacco: Never Used  Substance Use Topics  . Alcohol use: No  . Drug use: No    Current Outpatient Medications:  .  Ascorbic Acid (VITAMIN C PO), Take 1 capsule by mouth., Disp: , Rfl:  .  Calcium Carbonate-Vit D-Min (CALCIUM 1200 PO), Take 1 tablet by mouth daily., Disp: , Rfl:  .  Flaxseed, Linseed, (FLAX SEED OIL) 1000 MG CAPS, Take 1 capsule by mouth daily., Disp: , Rfl:  .  hydrochlorothiazide (HYDRODIURIL) 12.5 MG tablet, Take 1 tablet (12.5 mg total) by mouth daily., Disp: 90 tablet, Rfl: 3 .  Omega-3 Fatty Acids (FISH OIL) 1000 MG CAPS, Take 2 capsules by mouth daily., Disp: , Rfl:  .  pravastatin (PRAVACHOL) 10 MG tablet, Take 1 tablet (10 mg total) by mouth daily., Disp: 90  tablet, Rfl: 3 .  psyllium (METAMUCIL) 58.6 % powder, Take 1 packet by mouth daily., Disp: , Rfl:  .  Vitamin D, Cholecalciferol, 1000 UNITS CAPS, Take 1 capsule by mouth daily., Disp: , Rfl:  Allergies: Simvastatin  Review of Systems  Constitutional: Negative for chills, fever and malaise/fatigue.  HENT: Negative for congestion, sinus pain and sore throat.   Eyes: Negative for blurred vision and pain.  Respiratory: Negative for cough and wheezing.   Cardiovascular: Negative for chest pain and leg swelling.  Gastrointestinal: Negative for abdominal pain, constipation, diarrhea, heartburn, nausea and vomiting.  Genitourinary: Negative for dysuria, frequency, hematuria and urgency.  Musculoskeletal: Negative for back pain, joint pain, myalgias and neck pain.  Skin: Negative for itching and rash.  Neurological: Negative for dizziness, tremors and weakness.  Endo/Heme/Allergies: Does not bruise/bleed easily.  Psychiatric/Behavioral: Negative for depression. The patient is not nervous/anxious and does not have insomnia.     Objective: BP 138/80   Ht 5\' 6"  (1.676 m)   Wt 254 lb (115.2 kg)   BMI 41.00 kg/m   Filed Weights   04/25/18 1003  Weight: 254 lb (115.2 kg)   Body mass index is 41 kg/m. Physical Exam  Constitutional: She is oriented to person, place, and time. She appears well-developed and well-nourished.  No distress.  Genitourinary: Rectum normal, vagina normal and uterus normal. Pelvic exam was performed with patient supine. There is no rash or lesion on the right labia. There is no rash or lesion on the left labia. Vagina exhibits no lesion. No bleeding in the vagina. Right adnexum does not display mass and does not display tenderness. Left adnexum does not display mass and does not display tenderness. Cervix does not exhibit motion tenderness, lesion, friability or polyp.   Uterus is mobile and midaxial. Uterus is not enlarged or exhibiting a mass.  HENT:  Head:  Normocephalic and atraumatic. Head is without laceration.  Right Ear: Hearing normal.  Left Ear: Hearing normal.  Nose: No epistaxis.  No foreign bodies.  Mouth/Throat: Uvula is midline, oropharynx is clear and moist and mucous membranes are normal.  Eyes: Pupils are equal, round, and reactive to light.  Neck: Normal range of motion. Neck supple. No thyromegaly present.  Cardiovascular: Normal rate and regular rhythm. Exam reveals no gallop and no friction rub.  No murmur heard. Pulmonary/Chest: Effort normal and breath sounds normal. No respiratory distress. She has no wheezes. Right breast exhibits no mass, no skin change and no tenderness. Left breast exhibits no mass, no skin change and no tenderness.  Abdominal: Soft. Bowel sounds are normal. She exhibits no distension. There is no tenderness. There is no rebound.  Musculoskeletal: Normal range of motion.  Neurological: She is alert and oriented to person, place, and time. No cranial nerve deficit.  Skin: Skin is warm and dry.  Psychiatric: She has a normal mood and affect. Judgment normal.  Vitals reviewed.  Assessment: Annual Exam 1. Annual physical exam   2. Screening for cervical cancer   3. Screening for breast cancer   4. Screen for colon cancer   5. Screening for osteoporosis    Plan:            1.  Cervical Screening-  Pap smear done today  2. Breast screening- Exam annually and mammogram scheduled  3. Colonoscopy every 10 years, Hemoccult testing after age 4  4. Labs managed by PCP  5. Counseling for hormonal therapy: none  6. Flu shot done  7. Desires DEXA, last one 2014    F/U  Return in about 1 year (around 04/26/2019) for Annual.  Barnett Applebaum, MD, [facility], [facility] 04/25/2018  12:21 PM

## 2018-04-27 ENCOUNTER — Other Ambulatory Visit: Payer: Self-pay | Admitting: Obstetrics & Gynecology

## 2018-04-27 ENCOUNTER — Ambulatory Visit: Payer: Self-pay | Admitting: Obstetrics & Gynecology

## 2018-04-27 DIAGNOSIS — M8580 Other specified disorders of bone density and structure, unspecified site: Secondary | ICD-10-CM | POA: Insufficient documentation

## 2018-04-27 DIAGNOSIS — E2839 Other primary ovarian failure: Secondary | ICD-10-CM | POA: Insufficient documentation

## 2018-04-30 ENCOUNTER — Other Ambulatory Visit: Payer: Self-pay | Admitting: Obstetrics & Gynecology

## 2018-04-30 DIAGNOSIS — Z1239 Encounter for other screening for malignant neoplasm of breast: Secondary | ICD-10-CM

## 2018-04-30 LAB — CYTOLOGY - PAP
Diagnosis: NEGATIVE
HPV: NOT DETECTED

## 2018-04-30 NOTE — Progress Notes (Signed)
I looked in the log book, I did not sign one out. So I don't think I gave her a flu shot.

## 2018-05-01 NOTE — Progress Notes (Signed)
No I did not give the flu shot

## 2018-05-01 NOTE — Progress Notes (Signed)
I did not give the flu shot

## 2018-05-03 ENCOUNTER — Other Ambulatory Visit: Payer: Self-pay | Admitting: Obstetrics & Gynecology

## 2018-05-03 DIAGNOSIS — H2510 Age-related nuclear cataract, unspecified eye: Secondary | ICD-10-CM | POA: Diagnosis not present

## 2018-05-03 DIAGNOSIS — Z1231 Encounter for screening mammogram for malignant neoplasm of breast: Secondary | ICD-10-CM

## 2018-05-10 ENCOUNTER — Other Ambulatory Visit: Payer: Medicare Other

## 2018-06-15 NOTE — Telephone Encounter (Signed)
This encounter was created in error - please disregard.

## 2018-07-05 DIAGNOSIS — Z1231 Encounter for screening mammogram for malignant neoplasm of breast: Secondary | ICD-10-CM | POA: Diagnosis not present

## 2018-07-12 ENCOUNTER — Ambulatory Visit
Admission: RE | Admit: 2018-07-12 | Discharge: 2018-07-12 | Disposition: A | Discharge status: Home / Self Care | Payer: Medicare Other | Source: Ambulatory Visit | Attending: Obstetrics & Gynecology | Admitting: Obstetrics & Gynecology

## 2018-07-12 DIAGNOSIS — E2839 Other primary ovarian failure: Secondary | ICD-10-CM | POA: Diagnosis not present

## 2018-07-12 DIAGNOSIS — M8588 Other specified disorders of bone density and structure, other site: Secondary | ICD-10-CM | POA: Diagnosis not present

## 2018-07-24 DIAGNOSIS — Z86018 Personal history of other benign neoplasm: Secondary | ICD-10-CM | POA: Diagnosis not present

## 2018-07-24 DIAGNOSIS — Z872 Personal history of diseases of the skin and subcutaneous tissue: Secondary | ICD-10-CM | POA: Diagnosis not present

## 2018-07-24 DIAGNOSIS — L578 Other skin changes due to chronic exposure to nonionizing radiation: Secondary | ICD-10-CM | POA: Diagnosis not present

## 2018-07-24 DIAGNOSIS — Z1283 Encounter for screening for malignant neoplasm of skin: Secondary | ICD-10-CM | POA: Diagnosis not present

## 2018-07-24 DIAGNOSIS — Z85820 Personal history of malignant melanoma of skin: Secondary | ICD-10-CM | POA: Diagnosis not present

## 2018-08-09 ENCOUNTER — Telehealth: Payer: Self-pay

## 2018-08-09 NOTE — Telephone Encounter (Signed)
-----   Message from Gae Dry, MD sent at 08/08/2018 11:45 AM EST ----- Regarding: MMG Received notice she has not received MMG yet as ordered at her Annual. Please check and encourage her to do this, and document conversation.

## 2018-08-09 NOTE — Telephone Encounter (Signed)
Left message to advise pt to get her mammo

## 2018-09-20 ENCOUNTER — Other Ambulatory Visit: Payer: Medicare Other

## 2018-09-24 DIAGNOSIS — E782 Mixed hyperlipidemia: Secondary | ICD-10-CM | POA: Diagnosis not present

## 2018-09-24 DIAGNOSIS — I10 Essential (primary) hypertension: Secondary | ICD-10-CM | POA: Diagnosis not present

## 2018-09-26 ENCOUNTER — Encounter: Payer: Medicare Other | Admitting: Internal Medicine

## 2018-09-28 DIAGNOSIS — M8580 Other specified disorders of bone density and structure, unspecified site: Secondary | ICD-10-CM | POA: Diagnosis not present

## 2018-09-28 DIAGNOSIS — Z79899 Other long term (current) drug therapy: Secondary | ICD-10-CM | POA: Diagnosis not present

## 2018-09-28 DIAGNOSIS — E782 Mixed hyperlipidemia: Secondary | ICD-10-CM | POA: Diagnosis not present

## 2018-09-28 DIAGNOSIS — I10 Essential (primary) hypertension: Secondary | ICD-10-CM | POA: Diagnosis not present

## 2018-09-28 DIAGNOSIS — Z0000 Encounter for general adult medical examination without abnormal findings: Secondary | ICD-10-CM | POA: Diagnosis not present

## 2019-02-01 ENCOUNTER — Other Ambulatory Visit: Payer: Self-pay

## 2019-02-19 ENCOUNTER — Telehealth: Payer: Self-pay | Admitting: Obstetrics & Gynecology

## 2019-02-19 NOTE — Telephone Encounter (Signed)
-----   Message from Gae Dry, MD sent at 02/16/2019  8:32 AM EDT ----- Regarding: Sch Annual in [employer], also encourage MMG anytime prior  Mammogram    Call [phone] to schedule at [facility]

## 2019-02-19 NOTE — Telephone Encounter (Signed)
Called and left generic message for patient to call back to be schedule

## 2019-02-21 NOTE — Telephone Encounter (Signed)
Patient has Medicare, covered every 2 years, will call next to schedule.

## 2019-03-27 DIAGNOSIS — Z23 Encounter for immunization: Secondary | ICD-10-CM | POA: Diagnosis not present

## 2019-05-07 DIAGNOSIS — H2510 Age-related nuclear cataract, unspecified eye: Secondary | ICD-10-CM | POA: Diagnosis not present

## 2019-05-14 ENCOUNTER — Other Ambulatory Visit: Payer: Self-pay | Admitting: Obstetrics & Gynecology

## 2019-05-14 DIAGNOSIS — Z1231 Encounter for screening mammogram for malignant neoplasm of breast: Secondary | ICD-10-CM

## 2019-05-21 ENCOUNTER — Telehealth: Payer: Self-pay | Admitting: Obstetrics & Gynecology

## 2019-05-21 ENCOUNTER — Other Ambulatory Visit: Payer: Self-pay | Admitting: Student

## 2019-05-21 ENCOUNTER — Other Ambulatory Visit: Payer: Self-pay | Admitting: Obstetrics & Gynecology

## 2019-05-21 DIAGNOSIS — Z1231 Encounter for screening mammogram for malignant neoplasm of breast: Secondary | ICD-10-CM

## 2019-05-21 NOTE — Telephone Encounter (Signed)
Called and left voice mail for patient to call back to be schedule °

## 2019-05-21 NOTE — Telephone Encounter (Signed)
-----   Message from Gae Dry, MD sent at 05/20/2019  7:57 AM EST ----- Regarding: Annual needed, plx schedule

## 2019-05-29 ENCOUNTER — Other Ambulatory Visit: Payer: Self-pay

## 2019-07-19 ENCOUNTER — Ambulatory Visit
Admission: RE | Admit: 2019-07-19 | Discharge: 2019-07-19 | Disposition: A | Discharge status: Home / Self Care | Payer: Medicare Other | Source: Ambulatory Visit | Attending: Student | Admitting: Student

## 2019-07-19 DIAGNOSIS — Z1231 Encounter for screening mammogram for malignant neoplasm of breast: Secondary | ICD-10-CM | POA: Diagnosis not present

## 2019-08-02 ENCOUNTER — Ambulatory Visit: Payer: PRIVATE HEALTH INSURANCE

## 2019-08-19 ENCOUNTER — Ambulatory Visit: Payer: PRIVATE HEALTH INSURANCE

## 2020-02-13 ENCOUNTER — Other Ambulatory Visit: Payer: Self-pay | Admitting: Student

## 2020-02-13 DIAGNOSIS — Z1231 Encounter for screening mammogram for malignant neoplasm of breast: Secondary | ICD-10-CM

## 2020-07-22 ENCOUNTER — Other Ambulatory Visit: Payer: Self-pay

## 2020-07-22 ENCOUNTER — Ambulatory Visit
Admission: RE | Admit: 2020-07-22 | Discharge: 2020-07-22 | Disposition: A | Discharge status: Home / Self Care | Payer: Medicare Other | Source: Ambulatory Visit | Attending: Student | Admitting: Student

## 2020-07-22 DIAGNOSIS — Z1231 Encounter for screening mammogram for malignant neoplasm of breast: Secondary | ICD-10-CM | POA: Insufficient documentation

## 2020-07-29 ENCOUNTER — Other Ambulatory Visit: Payer: Self-pay

## 2020-07-29 ENCOUNTER — Ambulatory Visit
Admission: RE | Admit: 2020-07-29 | Discharge: 2020-07-29 | Disposition: A | Discharge status: Home / Self Care | Payer: Medicare Other | Source: Ambulatory Visit | Attending: Student | Admitting: Student

## 2020-07-29 ENCOUNTER — Other Ambulatory Visit: Payer: Self-pay | Admitting: Student

## 2020-07-29 DIAGNOSIS — R928 Other abnormal and inconclusive findings on diagnostic imaging of breast: Secondary | ICD-10-CM

## 2020-07-29 DIAGNOSIS — N6489 Other specified disorders of breast: Secondary | ICD-10-CM | POA: Insufficient documentation

## 2021-02-10 ENCOUNTER — Other Ambulatory Visit: Payer: Self-pay | Admitting: Student

## 2021-02-10 DIAGNOSIS — Z1231 Encounter for screening mammogram for malignant neoplasm of breast: Secondary | ICD-10-CM

## 2021-07-23 ENCOUNTER — Encounter: Payer: Medicare Other | Admitting: Radiology

## 2021-07-28 ENCOUNTER — Other Ambulatory Visit: Payer: Self-pay

## 2021-07-28 ENCOUNTER — Ambulatory Visit
Admission: RE | Admit: 2021-07-28 | Discharge: 2021-07-28 | Disposition: A | Discharge status: Home / Self Care | Payer: Medicare Other | Source: Ambulatory Visit | Attending: Student | Admitting: Student

## 2021-07-28 DIAGNOSIS — Z1231 Encounter for screening mammogram for malignant neoplasm of breast: Secondary | ICD-10-CM | POA: Diagnosis present

## 2021-11-22 IMAGING — MG MM DIGITAL DIAGNOSTIC UNILAT*L* W/ TOMO W/ CAD
4 series · 4 of 12 positions shown · non-contrast
Comparison: Previous exam(s).

CLINICAL DATA: 69-year-old female presenting as a recall from
screening for possible left breast asymmetry.

EXAM:
DIGITAL DIAGNOSTIC UNILATERAL LEFT MAMMOGRAM WITH TOMO AND CAD
TECHNIQUE: Left digital diagnostic mammography and breast tomosynthesis was
performed. Digital images of the breasts were evaluated with
computer-aided detection.

[L ML synth-2D]
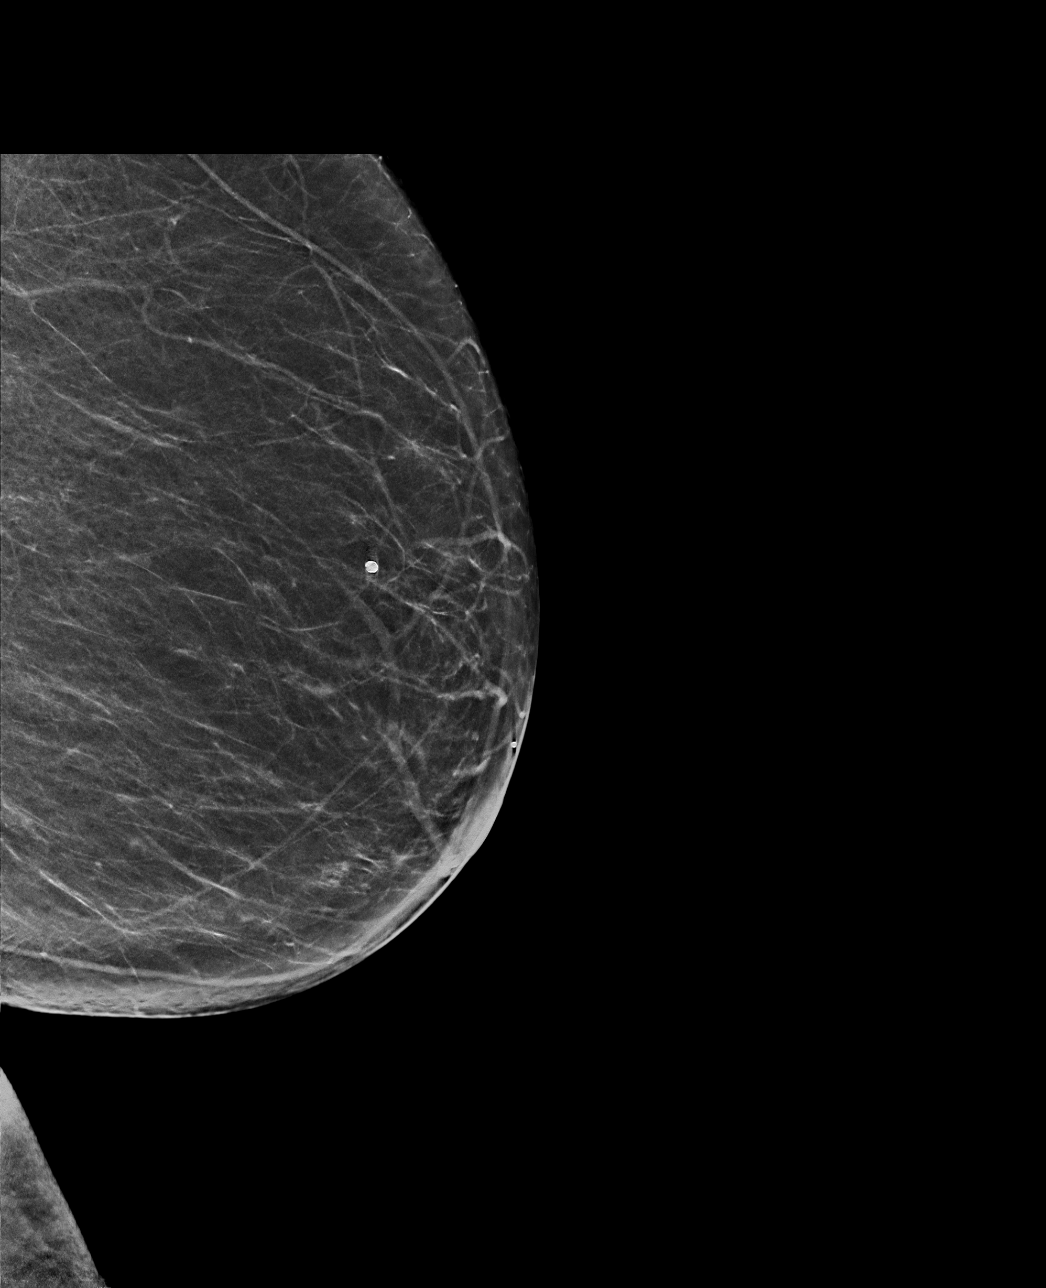

[L CC synth-2D]
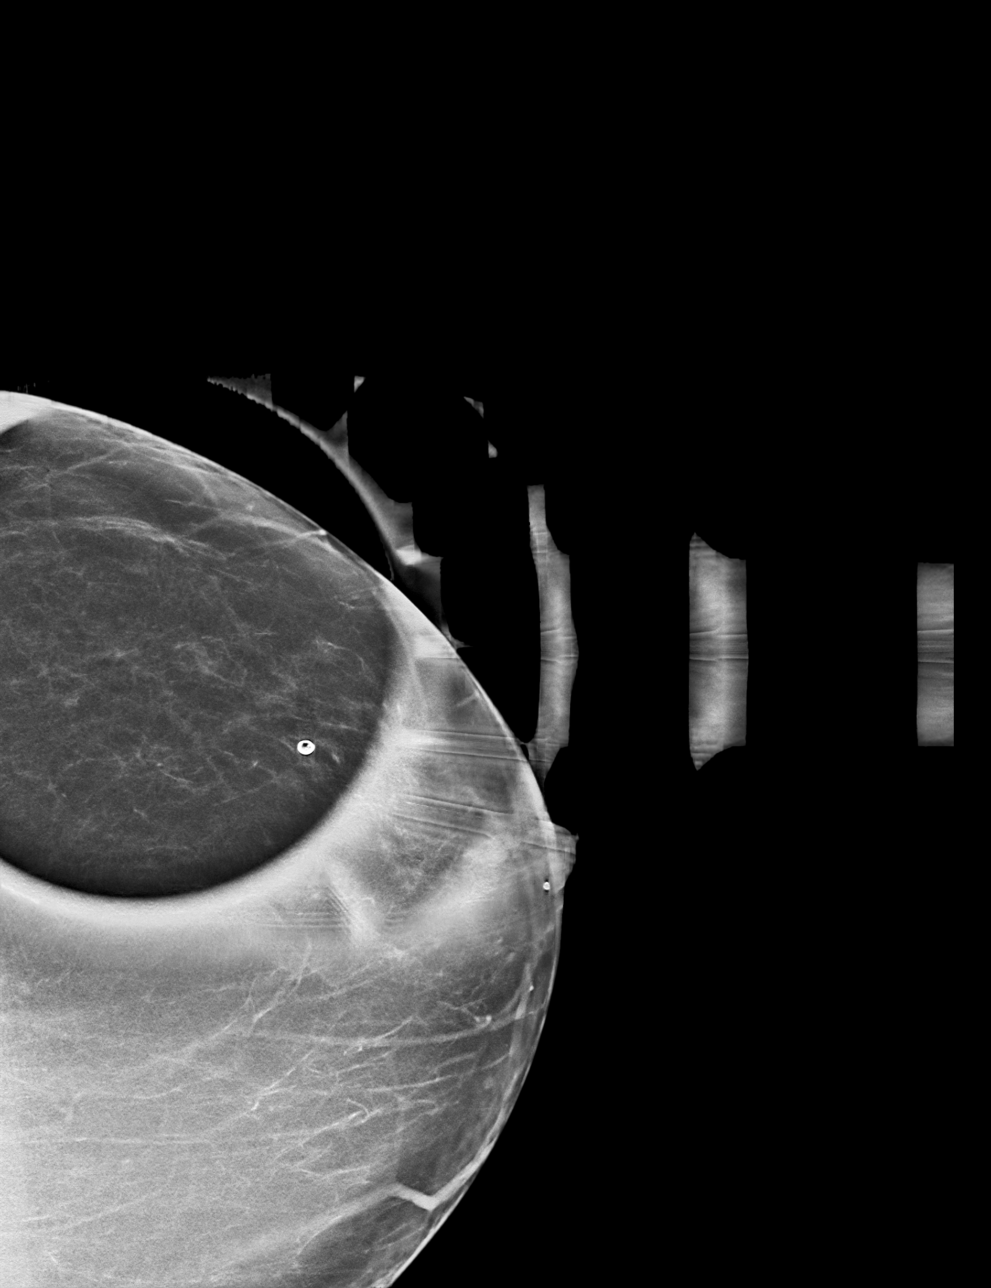

[L ML tomo · tomo slice 33/66.0]
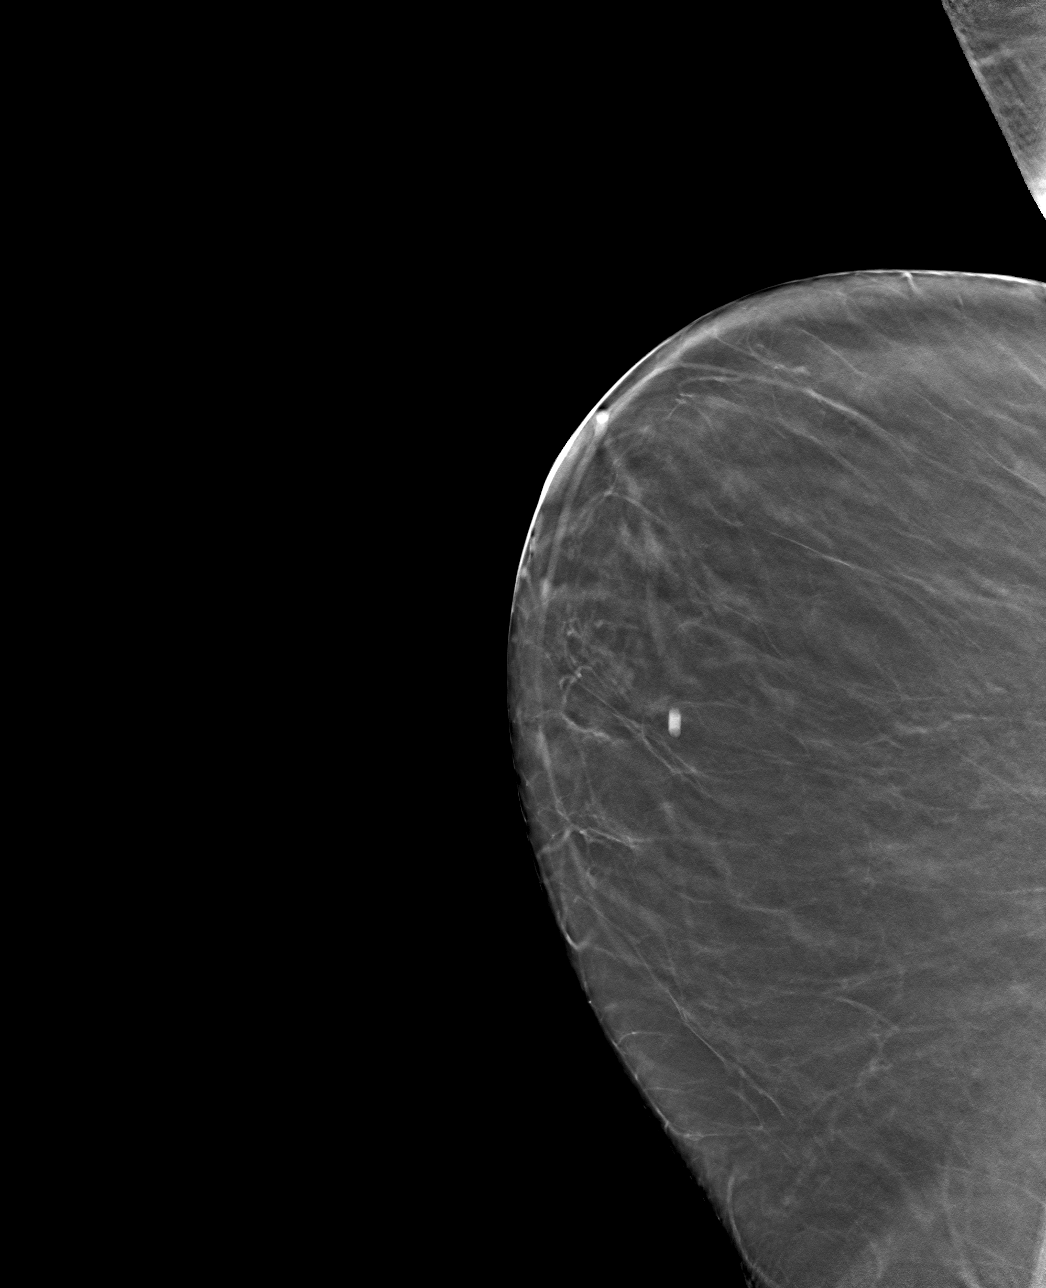

[L CC tomo · tomo slice 29/56.0]
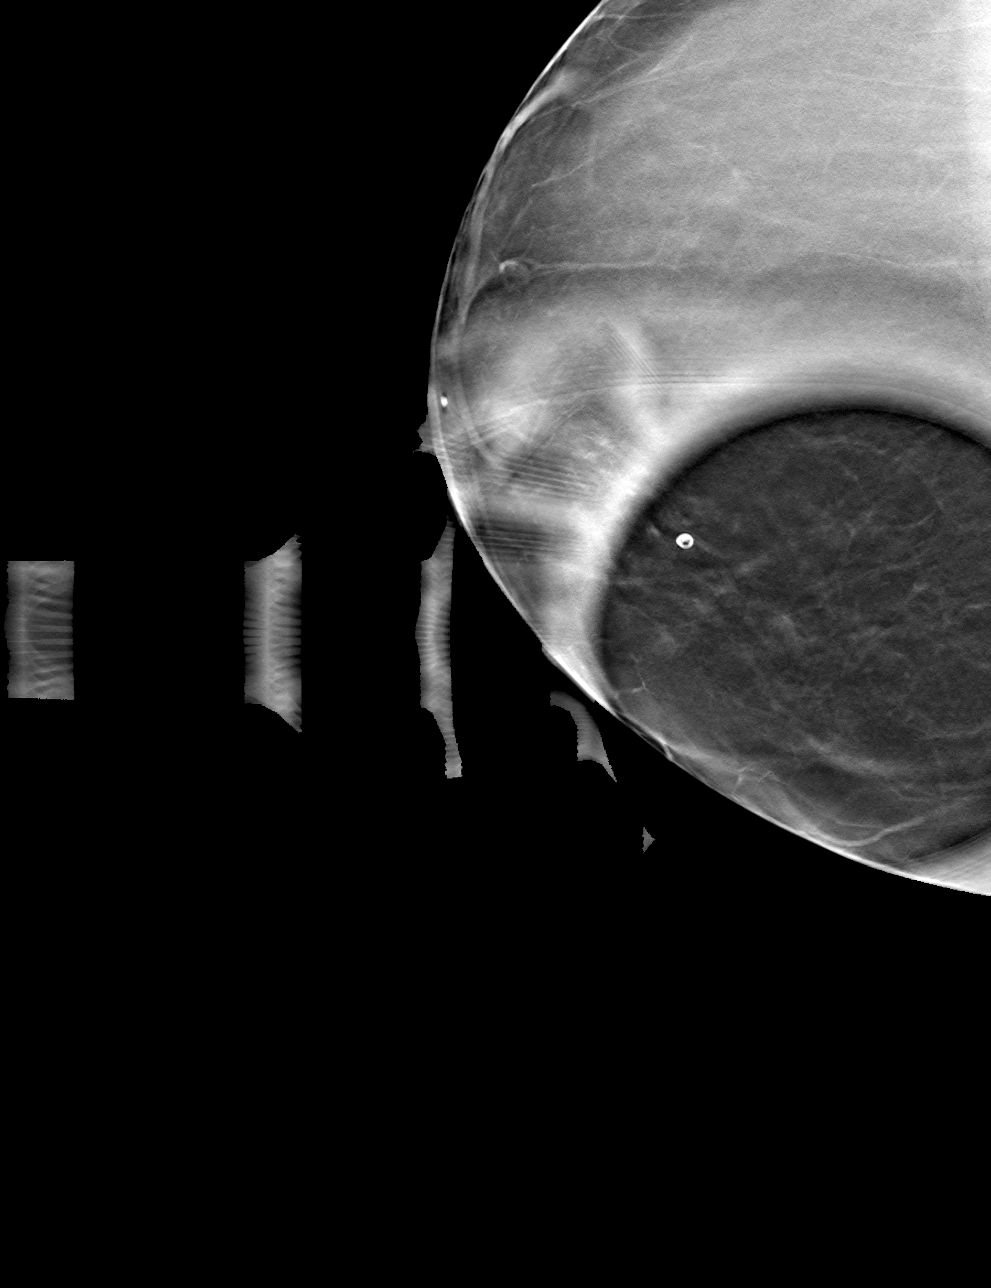

[4 of 12 positions shown; findings below may reference images not displayed]

ACR Breast Density Category b: There are scattered areas of
fibroglandular density.
FINDINGS: Spot compression cc tomosynthesis and full field mL tomosynthesis
views of the left breast performed for a questioned asymmetry seen
only on the cc view in the outer left breast. On the additional
imaging the tissue in this area disperses without persistent
asymmetry, mass, or distortion. The finding on screening mammogram
most likely represented normal overlapping fibroglandular tissue.
IMPRESSION: No mammographic evidence of malignancy in the left breast.

RECOMMENDATION:
Screening mammogram in one year.(Code:[id number])

I have discussed the findings and recommendations with the patient.
If applicable, a reminder letter will be sent to the patient
regarding the next appointment.

BI-RADS CATEGORY  1: Negative.

## 2022-05-17 ENCOUNTER — Other Ambulatory Visit: Payer: Self-pay | Admitting: Student

## 2022-05-17 DIAGNOSIS — Z1231 Encounter for screening mammogram for malignant neoplasm of breast: Secondary | ICD-10-CM

## 2022-08-02 ENCOUNTER — Ambulatory Visit
Admission: RE | Admit: 2022-08-02 | Discharge: 2022-08-02 | Disposition: A | Discharge status: Home / Self Care | Payer: Medicare Other | Source: Ambulatory Visit | Attending: Student | Admitting: Student

## 2022-08-02 DIAGNOSIS — Z1231 Encounter for screening mammogram for malignant neoplasm of breast: Secondary | ICD-10-CM | POA: Insufficient documentation

## 2023-06-06 ENCOUNTER — Encounter: Payer: Self-pay | Admitting: Ophthalmology

## 2023-06-09 NOTE — Discharge Instructions (Signed)

## 2023-06-13 ENCOUNTER — Ambulatory Visit: Payer: Medicare Other | Admitting: Anesthesiology

## 2023-06-13 ENCOUNTER — Other Ambulatory Visit: Payer: Self-pay

## 2023-06-13 ENCOUNTER — Ambulatory Visit
Admission: RE | Admit: 2023-06-13 | Discharge: 2023-06-13 | Disposition: A | Discharge status: Home / Self Care | Payer: Medicare Other | Attending: Ophthalmology | Admitting: Ophthalmology

## 2023-06-13 ENCOUNTER — Encounter: Payer: Self-pay | Admitting: Ophthalmology

## 2023-06-13 ENCOUNTER — Encounter
Admission: RE | Disposition: A | Discharge status: Home / Self Care | Payer: Self-pay | Source: Home / Self Care | Attending: Ophthalmology

## 2023-06-13 DIAGNOSIS — H2511 Age-related nuclear cataract, right eye: Secondary | ICD-10-CM | POA: Insufficient documentation

## 2023-06-13 DIAGNOSIS — I10 Essential (primary) hypertension: Secondary | ICD-10-CM | POA: Insufficient documentation

## 2023-06-13 HISTORY — DX: Unspecified osteoarthritis, unspecified site: M19.90

## 2023-06-13 HISTORY — PX: CATARACT EXTRACTION W/PHACO: SHX586

## 2023-06-13 SURGERY — PHACOEMULSIFICATION, CATARACT, WITH IOL INSERTION
Anesthesia: Monitor Anesthesia Care | Site: Eye | Laterality: Right

## 2023-06-13 MED ORDER — BRIMONIDINE TARTRATE-TIMOLOL 0.2-0.5 % OP SOLN
OPHTHALMIC | Status: DC | PRN
  Administered 2023-06-13: 1 [drp] via OPHTHALMIC

## 2023-06-13 MED ORDER — TETRACAINE HCL 0.5 % OP SOLN
OPHTHALMIC | Status: AC
  Filled 2023-06-13: qty 4

## 2023-06-13 MED ORDER — FENTANYL CITRATE (PF) 100 MCG/2ML IJ SOLN
INTRAMUSCULAR | Status: DC | PRN
  Administered 2023-06-13: 50 ug via INTRAVENOUS

## 2023-06-13 MED ORDER — SIGHTPATH DOSE#1 BSS IO SOLN
INTRAOCULAR | Status: DC | PRN
  Administered 2023-06-13: 15 mL

## 2023-06-13 MED ORDER — ARMC OPHTHALMIC DILATING DROPS
1.0000 | OPHTHALMIC | Status: DC | PRN
  Administered 2023-06-13 (×3): 1 via OPHTHALMIC

## 2023-06-13 MED ORDER — SIGHTPATH DOSE#1 BSS IO SOLN
INTRAOCULAR | Status: DC | PRN
  Administered 2023-06-13: 1 mL via INTRAMUSCULAR

## 2023-06-13 MED ORDER — TETRACAINE HCL 0.5 % OP SOLN
1.0000 [drp] | OPHTHALMIC | Status: DC | PRN
  Administered 2023-06-13 (×3): 1 [drp] via OPHTHALMIC

## 2023-06-13 MED ORDER — MIDAZOLAM HCL 2 MG/2ML IJ SOLN
INTRAMUSCULAR | Status: AC
  Filled 2023-06-13: qty 2

## 2023-06-13 MED ORDER — MOXIFLOXACIN HCL 0.5 % OP SOLN
OPHTHALMIC | Status: DC | PRN
  Administered 2023-06-13: .2 mL via OPHTHALMIC

## 2023-06-13 MED ORDER — SIGHTPATH DOSE#1 BSS IO SOLN
INTRAOCULAR | Status: DC | PRN
  Administered 2023-06-13: 49 mL via OPHTHALMIC

## 2023-06-13 MED ORDER — MIDAZOLAM HCL 2 MG/2ML IJ SOLN
INTRAMUSCULAR | Status: DC | PRN
  Administered 2023-06-13: 1 mg via INTRAVENOUS

## 2023-06-13 MED ORDER — FENTANYL CITRATE (PF) 100 MCG/2ML IJ SOLN
INTRAMUSCULAR | Status: AC
  Filled 2023-06-13: qty 2

## 2023-06-13 MED ORDER — SIGHTPATH DOSE#1 NA CHONDROIT SULF-NA HYALURON 40-17 MG/ML IO SOLN
INTRAOCULAR | Status: DC | PRN
  Administered 2023-06-13: 1 mL via INTRAOCULAR

## 2023-06-13 SURGICAL SUPPLY — 10 items
ANGLE REVERSE CUT SHRT 25GA (CUTTER) ×1
CATARACT SUITE SIGHTPATH (MISCELLANEOUS) ×1
CYSTOTOME ANGL RVRS SHRT 25G (CUTTER) ×1 IMPLANT
FEE CATARACT SUITE SIGHTPATH (MISCELLANEOUS) ×1 IMPLANT
GLOVE BIOGEL PI IND STRL 8 (GLOVE) ×1 IMPLANT
GLOVE SURG LX STRL 8.0 MICRO (GLOVE) ×1 IMPLANT
LENS IOL TECNIS EYHANCE 22.5 (Intraocular Lens) IMPLANT
NDL FILTER BLUNT 18X1 1/2 (NEEDLE) ×1 IMPLANT
NEEDLE FILTER BLUNT 18X1 1/2 (NEEDLE) ×1
SYR 3ML LL SCALE MARK (SYRINGE) ×1 IMPLANT

## 2023-06-13 NOTE — Anesthesia Preprocedure Evaluation (Signed)
Anesthesia Evaluation  Patient identified by MRN, date of birth, ID band Patient awake    Reviewed: Allergy & Precautions, H&P , NPO status , Patient's Chart, lab work & pertinent test results  Airway Mallampati: IV  TM Distance: <3 FB Neck ROM: Full   Comment: Very short TMD, one FB, highly likely anterior airway   Dental no notable dental hx.  Permanent retainer behind upper teeth:   Pulmonary neg pulmonary ROS Snores, discussed risks untreated sleep apnea and strongly encouraged seeking sleep study.    Pulmonary exam normal breath sounds clear to auscultation       Cardiovascular hypertension, negative cardio ROS Normal cardiovascular exam Rhythm:Regular Rate:Normal     Neuro/Psych negative neurological ROS  negative psych ROS   GI/Hepatic negative GI ROS, Neg liver ROS,,,  Endo/Other  negative endocrine ROS    Renal/GU negative Renal ROS  negative genitourinary   Musculoskeletal negative musculoskeletal ROS (+) Arthritis ,    Abdominal   Peds negative pediatric ROS (+)  Hematology negative hematology ROS (+)   Anesthesia Other Findings Diverticulitis Melanoma of foot  Hyperlipidemia Hypertension Diverticulosis Arthritis   Reproductive/Obstetrics negative OB ROS                              Anesthesia Physical Anesthesia Plan  ASA: 2  Anesthesia Plan: MAC   Post-op Pain Management:    Induction: Intravenous  PONV Risk Score and Plan:   Airway Management Planned: Natural Airway and Nasal Cannula  Additional Equipment:   Intra-op Plan:   Post-operative Plan:   Informed Consent: I have reviewed the patients History and Physical, chart, labs and discussed the procedure including the risks, benefits and alternatives for the proposed anesthesia with the patient or authorized representative who has indicated his/her understanding and acceptance.     Dental Advisory  Given  Plan Discussed with: Anesthesiologist, CRNA and Surgeon  Anesthesia Plan Comments: (Patient consented for risks of anesthesia including but not limited to:  - adverse reactions to medications - damage to eyes, teeth, lips or other oral mucosa - nerve damage due to positioning  - sore throat or hoarseness - Damage to heart, brain, nerves, lungs, other parts of body or loss of life  Patient voiced understanding and assent.)         Anesthesia Quick Evaluation

## 2023-06-13 NOTE — Anesthesia Postprocedure Evaluation (Signed)
Anesthesia Post Note  Patient: Cindy Mendoza  Procedure(s) Performed: CATARACT EXTRACTION PHACO AND INTRAOCULAR LENS PLACEMENT (IOC) RIGHT  11.47  00:53.6 (Right: Eye)  Patient location during evaluation: PACU Anesthesia Type: MAC Level of consciousness: awake and alert Pain management: pain level controlled Vital Signs Assessment: post-procedure vital signs reviewed and stable Respiratory status: spontaneous breathing, nonlabored ventilation, respiratory function stable and patient connected to nasal cannula oxygen Cardiovascular status: stable and blood pressure returned to baseline Postop Assessment: no apparent nausea or vomiting Anesthetic complications: no   No notable events documented.   Last Vitals:  Vitals:   06/13/23 0841 06/13/23 0846  BP: 113/71 121/62  Pulse: 60 (!) 58  Resp: 13 18  Temp: (!) 36.3 C (!) 36.3 C  SpO2: 100% 99%    Last Pain:  Vitals:   06/13/23 0846  TempSrc:   PainSc: 0-No pain                 Juanna Pudlo C Timmya Blazier

## 2023-06-13 NOTE — Transfer of Care (Signed)
Immediate Anesthesia Transfer of Care Note  Patient: Cindy Mendoza  Procedure(s) Performed: CATARACT EXTRACTION PHACO AND INTRAOCULAR LENS PLACEMENT (IOC) RIGHT  11.47  00:53.6 (Right: Eye)  Patient Location: PACU  Anesthesia Type: MAC  Level of Consciousness: awake, alert  and patient cooperative  Airway and Oxygen Therapy: Patient Spontanous Breathing and Patient connected to supplemental oxygen  Post-op Assessment: Post-op Vital signs reviewed, Patient's Cardiovascular Status Stable, Respiratory Function Stable, Patent Airway and No signs of Nausea or vomiting  Post-op Vital Signs: Reviewed and stable  Complications: No notable events documented.

## 2023-06-13 NOTE — Op Note (Signed)
PREOPERATIVE DIAGNOSIS:  Nuclear sclerotic cataract of the right eye.   POSTOPERATIVE DIAGNOSIS:  H25.11 Cataract   OPERATIVE PROCEDURE:ORPROCALL@   SURGEON:  Cindy Manila, MD.   ANESTHESIA:  Anesthesiologist: Marisue Humble, MD CRNA: Andee Poles, CRNA  1.      Managed anesthesia care. 2.      0.63ml of Shugarcaine was instilled in the eye following the paracentesis.   COMPLICATIONS:  None.   TECHNIQUE:   Stop and chop   DESCRIPTION OF PROCEDURE:  The patient was examined and consented in the preoperative holding area where the aforementioned topical anesthesia was applied to the right eye and then brought back to the Operating Room where the right eye was prepped and draped in the usual sterile ophthalmic fashion and a lid speculum was placed. A paracentesis was created with the side port blade and the anterior chamber was filled with viscoelastic. A near clear corneal incision was performed with the steel keratome. A continuous curvilinear capsulorrhexis was performed with a cystotome followed by the capsulorrhexis forceps. Hydrodissection and hydrodelineation were carried out with BSS on a blunt cannula. The lens was removed in a stop and chop  technique and the remaining cortical material was removed with the irrigation-aspiration handpiece. The capsular bag was inflated with viscoelastic and the Technis ZCB00  lens was placed in the capsular bag without complication. The remaining viscoelastic was removed from the eye with the irrigation-aspiration handpiece. The wounds were hydrated. The anterior chamber was flushed with BSS and the eye was inflated to physiologic pressure. 0.31ml of Vigamox was placed in the anterior chamber. The wounds were found to be water tight. The eye was dressed with Combigan. The patient was given protective glasses to wear throughout the day and a shield with which to sleep tonight. The patient was also given drops with which to begin a drop regimen today and  will follow-up with me in one day. Implant Name Type Inv. Item Serial No. Manufacturer Lot No. LRB No. Used Action  LENS IOL TECNIS EYHANCE 22.5 - [id number] Intraocular Lens LENS IOL TECNIS EYHANCE 22.5 [id number] [employer]  Right 1 Implanted   Procedure(s): CATARACT EXTRACTION PHACO AND INTRAOCULAR LENS PLACEMENT (IOC) RIGHT  11.47  00:53.6 (Right)  Electronically signed: Galen Mendoza 06/13/2023 8:40 AM

## 2023-06-13 NOTE — H&P (Signed)
[facility]   Primary Care Physician:  Carren Rang, PA-C Ophthalmologist: Dr. Druscilla Brownie  Pre-Procedure History & Physical: HPI:  Cindy Mendoza is a 72 y.o. female here for cataract surgery.   Past Medical History:  Diagnosis Date   Arthritis    Diverticulitis    Diverticulosis    Hyperlipidemia    Hypertension    Melanoma of foot (HCC) 07/04/1996    Past Surgical History:  Procedure Laterality Date   KNEE ARTHROSCOPY Right 1990's   MELANOMA EXCISION     Melanoma removal     TOOTH EXTRACTION      Prior to Admission medications   Medication Sig Start Date End Date Taking? Authorizing Provider  Ascorbic Acid (VITAMIN C PO) Take 1 capsule by mouth.   Yes [provider]  aspirin EC 81 MG tablet Take 81 mg by mouth daily. Swallow whole.   Yes [provider]  Calcium Carbonate-Vit D-Min (CALCIUM 1200 PO) Take 1 tablet by mouth daily.   Yes [provider]  hydrochlorothiazide (HYDRODIURIL) 12.5 MG tablet Take 1 tablet (12.5 mg total) by mouth daily. 09/21/17  Yes Karie Schwalbe, MD  Omega-3 Fatty Acids (FISH OIL) 1000 MG CAPS Take 2 capsules by mouth daily.   Yes [provider]  pravastatin (PRAVACHOL) 10 MG tablet Take 1 tablet (10 mg total) by mouth daily. 09/21/17  Yes Tillman Abide I, MD  psyllium (METAMUCIL) 58.6 % powder Take 1 packet by mouth daily.   Yes [provider]  Vitamin D, Cholecalciferol, 1000 UNITS CAPS Take 1 capsule by mouth daily.   Yes [provider]  zinc gluconate 50 MG tablet Take 50 mg by mouth daily.   Yes [provider]  Flaxseed, Linseed, (FLAX SEED OIL) 1000 MG CAPS Take 1 capsule by mouth daily. Patient not taking: Reported on 06/06/2023    [provider]    Allergies as of 05/31/2023 - Review Complete 04/25/2018  Allergen Reaction Noted   Simvastatin Other (See Comments) 08/08/2014    Family History  Problem Relation Age of Onset   Arthritis Mother     Cancer Father        lung cancer   Heart disease Sister     Social History   Socioeconomic History   Marital status: Married    Spouse name: Not on file   Number of children: 1   Years of education: Not on file   Highest education level: Not on file  Occupational History   Occupation: Receptionist in Education officer, community office    Comment: retired   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  Tobacco Use   Smoking status: Never   Smokeless tobacco: Never  Vaping Use   Vaping status: Never Used  Substance and Sexual Activity   Alcohol use: No   Drug use: No   Sexual activity: Yes  Other Topics Concern   Not on file  Social History Narrative   1 daughter      Has living will   Husband, then daughter, is health care POA   Would accept resuscitation   Not sure about tube feeds   Social Determinants of Health   Financial Resource Strain: Patient Declined (10/17/2022)   Received from [facility]   Overall Financial Resource Strain (CARDIA)    Difficulty of Paying Living Expenses: Patient declined  Food Insecurity: Not on file  Transportation Needs: Not on file  Physical Activity: Not on file  Stress: Not on file  Social Connections: Not on  file  Intimate Partner Violence: Not on file    Review of Systems: See HPI, otherwise negative ROS  Physical Exam: BP 139/76   Temp (!) 97.2 F (36.2 C) (Temporal)   Resp 16   Ht 5' 5.98" (1.676 m)   Wt 84.2 kg   SpO2 100%   BMI 29.97 kg/m  General:   Alert, cooperative in NAD Head:  Normocephalic and atraumatic. Respiratory:  Normal work of breathing. Cardiovascular:  RRR  Impression/Plan: Cindy Mendoza is here for cataract surgery.  Risks, benefits, limitations, and alternatives regarding cataract surgery have been reviewed with the patient.  Questions have been answered.  All parties agreeable.   Galen Manila, MD  06/13/2023, 8:12 AM

## 2023-06-14 ENCOUNTER — Encounter: Payer: Self-pay | Admitting: Ophthalmology

## 2023-06-14 ENCOUNTER — Other Ambulatory Visit: Payer: Self-pay

## 2023-06-19 ENCOUNTER — Other Ambulatory Visit: Payer: Self-pay | Admitting: Student

## 2023-06-19 DIAGNOSIS — Z1231 Encounter for screening mammogram for malignant neoplasm of breast: Secondary | ICD-10-CM

## 2023-06-19 NOTE — Anesthesia Preprocedure Evaluation (Addendum)
Anesthesia Evaluation  Patient identified by MRN, date of birth, ID band Patient awake    Reviewed: Allergy & Precautions, H&P , NPO status , Patient's Chart, lab work & pertinent test results  Airway Mallampati: IV  TM Distance: <3 FB Neck ROM: Full   Comment: Very short TMD, one FB, highly likely anterior airway    Dental no notable dental hx.  Permanent retainer behind upper teeth:   Pulmonary neg pulmonary ROS   Pulmonary exam normal breath sounds clear to auscultation       Cardiovascular hypertension, negative cardio ROS Normal cardiovascular exam Rhythm:Regular Rate:Normal     Neuro/Psych negative neurological ROS  negative psych ROS   GI/Hepatic negative GI ROS, Neg liver ROS,,,  Endo/Other  negative endocrine ROS    Renal/GU negative Renal ROS  negative genitourinary   Musculoskeletal negative musculoskeletal ROS (+) Arthritis ,    Abdominal   Peds negative pediatric ROS (+)  Hematology negative hematology ROS (+)   Anesthesia Other Findings Previous cataract surgery 06-13-23 with Dr. Juel Burrow anesthesiologist, Andee Poles CRNA, fentanyl 50 mcg IV, versed 1 mg IV previously    Medical History  Diverticulitis Melanoma of foot (HCC) Hyperlipidemia Hypertension Diverticulosis Arthritis    Reproductive/Obstetrics negative OB ROS                             Anesthesia Physical Anesthesia Plan  ASA: 2  Anesthesia Plan: MAC   Post-op Pain Management:    Induction: Intravenous  PONV Risk Score and Plan:   Airway Management Planned: Natural Airway and Nasal Cannula  Additional Equipment:   Intra-op Plan:   Post-operative Plan:   Informed Consent: I have reviewed the patients History and Physical, chart, labs and discussed the procedure including the risks, benefits and alternatives for the proposed anesthesia with the patient or authorized representative who has  indicated his/her understanding and acceptance.     Dental Advisory Given  Plan Discussed with: Anesthesiologist, CRNA and Surgeon  Anesthesia Plan Comments: (Patient consented for risks of anesthesia including but not limited to:  - adverse reactions to medications - damage to eyes, teeth, lips or other oral mucosa - nerve damage due to positioning  - sore throat or hoarseness - Damage to heart, brain, nerves, lungs, other parts of body or loss of life  Patient voiced understanding and assent.)        Anesthesia Quick Evaluation

## 2023-06-20 NOTE — Discharge Instructions (Signed)

## 2023-06-26 ENCOUNTER — Encounter
Admission: RE | Disposition: A | Discharge status: Home / Self Care | Payer: Self-pay | Source: Home / Self Care | Attending: Ophthalmology

## 2023-06-26 ENCOUNTER — Other Ambulatory Visit: Payer: Self-pay

## 2023-06-26 ENCOUNTER — Ambulatory Visit: Payer: Medicare Other | Admitting: Anesthesiology

## 2023-06-26 ENCOUNTER — Ambulatory Visit
Admission: RE | Admit: 2023-06-26 | Discharge: 2023-06-26 | Disposition: A | Discharge status: Home / Self Care | Payer: Medicare Other | Attending: Ophthalmology | Admitting: Ophthalmology

## 2023-06-26 ENCOUNTER — Encounter: Payer: Self-pay | Admitting: Ophthalmology

## 2023-06-26 DIAGNOSIS — H2512 Age-related nuclear cataract, left eye: Secondary | ICD-10-CM | POA: Diagnosis present

## 2023-06-26 DIAGNOSIS — I10 Essential (primary) hypertension: Secondary | ICD-10-CM | POA: Diagnosis not present

## 2023-06-26 HISTORY — PX: CATARACT EXTRACTION W/PHACO: SHX586

## 2023-06-26 SURGERY — PHACOEMULSIFICATION, CATARACT, WITH IOL INSERTION
Anesthesia: Monitor Anesthesia Care | Laterality: Left

## 2023-06-26 MED ORDER — ARMC OPHTHALMIC DILATING DROPS
OPHTHALMIC | Status: AC
  Filled 2023-06-26: qty 0.5

## 2023-06-26 MED ORDER — SIGHTPATH DOSE#1 BSS IO SOLN
INTRAOCULAR | Status: DC | PRN
  Administered 2023-06-26: 15 mL via INTRAOCULAR

## 2023-06-26 MED ORDER — MOXIFLOXACIN HCL 0.5 % OP SOLN
OPHTHALMIC | Status: DC | PRN
  Administered 2023-06-26: .2 mL via OPHTHALMIC

## 2023-06-26 MED ORDER — SIGHTPATH DOSE#1 BSS IO SOLN
INTRAOCULAR | Status: DC | PRN
  Administered 2023-06-26: 1 mL

## 2023-06-26 MED ORDER — TETRACAINE HCL 0.5 % OP SOLN
OPHTHALMIC | Status: AC
  Filled 2023-06-26: qty 4

## 2023-06-26 MED ORDER — SIGHTPATH DOSE#1 NA CHONDROIT SULF-NA HYALURON 40-17 MG/ML IO SOLN
INTRAOCULAR | Status: DC | PRN
  Administered 2023-06-26: 1 mL via INTRAOCULAR

## 2023-06-26 MED ORDER — MIDAZOLAM HCL 2 MG/2ML IJ SOLN
INTRAMUSCULAR | Status: DC | PRN
  Administered 2023-06-26: 1 mg via INTRAVENOUS

## 2023-06-26 MED ORDER — FENTANYL CITRATE (PF) 100 MCG/2ML IJ SOLN
INTRAMUSCULAR | Status: DC | PRN
  Administered 2023-06-26: 50 ug via INTRAVENOUS

## 2023-06-26 MED ORDER — MIDAZOLAM HCL 2 MG/2ML IJ SOLN
INTRAMUSCULAR | Status: AC
  Filled 2023-06-26: qty 2

## 2023-06-26 MED ORDER — FENTANYL CITRATE (PF) 100 MCG/2ML IJ SOLN
INTRAMUSCULAR | Status: AC
  Filled 2023-06-26: qty 2

## 2023-06-26 MED ORDER — SIGHTPATH DOSE#1 BSS IO SOLN
INTRAOCULAR | Status: DC | PRN
  Administered 2023-06-26: 45 mL via OPHTHALMIC

## 2023-06-26 MED ORDER — TETRACAINE HCL 0.5 % OP SOLN
1.0000 [drp] | OPHTHALMIC | Status: DC | PRN
  Administered 2023-06-26 (×3): 1 [drp] via OPHTHALMIC

## 2023-06-26 MED ORDER — ARMC OPHTHALMIC DILATING DROPS
1.0000 | OPHTHALMIC | Status: DC | PRN
  Administered 2023-06-26 (×3): 1 via OPHTHALMIC

## 2023-06-26 MED ORDER — BRIMONIDINE TARTRATE-TIMOLOL 0.2-0.5 % OP SOLN
OPHTHALMIC | Status: DC | PRN
  Administered 2023-06-26: 1 [drp] via OPHTHALMIC

## 2023-06-26 SURGICAL SUPPLY — 10 items
ANGLE REVERSE CUT SHRT 25GA (CUTTER) ×1
CATARACT SUITE SIGHTPATH (MISCELLANEOUS) ×1
CYSTOTOME ANGL RVRS SHRT 25G (CUTTER) ×1 IMPLANT
FEE CATARACT SUITE SIGHTPATH (MISCELLANEOUS) ×1 IMPLANT
GLOVE BIOGEL PI IND STRL 8 (GLOVE) ×1 IMPLANT
GLOVE SURG LX STRL 8.0 MICRO (GLOVE) ×1 IMPLANT
LENS IOL TECNIS EYHANCE 22.5 (Intraocular Lens) IMPLANT
NDL FILTER BLUNT 18X1 1/2 (NEEDLE) ×1 IMPLANT
NEEDLE FILTER BLUNT 18X1 1/2 (NEEDLE) ×1
SYR 3ML LL SCALE MARK (SYRINGE) ×1 IMPLANT

## 2023-06-26 NOTE — H&P (Signed)
[facility]   Primary Care Physician:  Carren Rang, PA-C Ophthalmologist: Dr. Druscilla Brownie  Pre-Procedure History & Physical: HPI:  Cindy Mendoza is a 73 y.o. female here for cataract surgery.   Past Medical History:  Diagnosis Date   Arthritis    Diverticulitis    Diverticulosis    Hyperlipidemia    Hypertension    Melanoma of foot (HCC) 07/04/1996    Past Surgical History:  Procedure Laterality Date   CATARACT EXTRACTION W/PHACO Right 06/13/2023   Procedure: CATARACT EXTRACTION PHACO AND INTRAOCULAR LENS PLACEMENT (IOC) RIGHT  11.47  00:53.6;  Surgeon: Galen Manila, MD;  Location: [facility];  Service: Ophthalmology;  Laterality: Right;   KNEE ARTHROSCOPY Right 1990's   MELANOMA EXCISION     Melanoma removal     TOOTH EXTRACTION      Prior to Admission medications   Medication Sig Start Date End Date Taking? Authorizing Provider  Ascorbic Acid (VITAMIN C PO) Take 1 capsule by mouth.   Yes [provider]  aspirin EC 81 MG tablet Take 81 mg by mouth daily. Swallow whole.   Yes [provider]  Calcium Carbonate-Vit D-Min (CALCIUM 1200 PO) Take 1 tablet by mouth daily.   Yes [provider]  hydrochlorothiazide (HYDRODIURIL) 12.5 MG tablet Take 1 tablet (12.5 mg total) by mouth daily. 09/21/17  Yes Karie Schwalbe, MD  Omega-3 Fatty Acids (FISH OIL) 1000 MG CAPS Take 2 capsules by mouth daily.   Yes [provider]  pravastatin (PRAVACHOL) 10 MG tablet Take 1 tablet (10 mg total) by mouth daily. 09/21/17  Yes Tillman Abide I, MD  psyllium (METAMUCIL) 58.6 % powder Take 1 packet by mouth daily.   Yes [provider]  Vitamin D, Cholecalciferol, 1000 UNITS CAPS Take 1 capsule by mouth daily.   Yes [provider]  zinc gluconate 50 MG tablet Take 50 mg by mouth daily.   Yes [provider]  Flaxseed, Linseed, (FLAX SEED OIL) 1000 MG CAPS Take 1 capsule by mouth daily. 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  Tobacco Use   Smoking status: Never   Smokeless tobacco: Never  Vaping Use   Vaping status: Never Used  Substance and Sexual Activity   Alcohol use: No   Drug use: No   Sexual activity: Yes  Other Topics Concern   Not on file  Social History Narrative   1 daughter      Has living will   Husband, then daughter, is health care POA   Would accept resuscitation   Not sure about tube feeds   Social Drivers of Health   Financial Resource Strain: Patient Declined (10/17/2022)   Received from [facility]   Overall Financial Resource Strain (  CARDIA)    Difficulty of Paying Living Expenses: Patient declined  Food Insecurity: Not on file  Transportation Needs: Not on file  Physical Activity: Not on file  Stress: Not on file  Social Connections: Not on file  Intimate Partner Violence: Not on file    Review of Systems: See HPI, otherwise negative ROS  Physical Exam: BP (!) 141/75   Temp (!) 97.5 F (36.4 C) (Temporal)   Resp 16   Ht 5' 5.98" (1.676 m)   Wt 83.2 kg   SpO2 100%   BMI 29.62 kg/m  General:   Alert, cooperative in NAD Head:  Normocephalic and atraumatic. Respiratory:  Normal work of breathing. Cardiovascular:  RRR  Impression/Plan: Cindy Mendoza is here for cataract surgery.  Risks, benefits, limitations,  and alternatives regarding cataract surgery have been reviewed with the patient.  Questions have been answered.  All parties agreeable.   Galen Manila, MD  06/26/2023, 8:40 AM

## 2023-06-26 NOTE — Op Note (Signed)
PREOPERATIVE DIAGNOSIS:  Nuclear sclerotic cataract of the left eye.   POSTOPERATIVE DIAGNOSIS:  Nuclear sclerotic cataract of the left eye.   OPERATIVE PROCEDURE:ORPROCALL@   SURGEON:  Galen Manila, MD.   ANESTHESIA:  Anesthesiologist: Marisue Humble, MD CRNA: Barbette Hair, CRNA  1.      Managed anesthesia care. 2.     0.63ml of Shugarcaine was instilled following the paracentesis   COMPLICATIONS:  None.   TECHNIQUE:   Stop and chop   DESCRIPTION OF PROCEDURE:  The patient was examined and consented in the preoperative holding area where the aforementioned topical anesthesia was applied to the left eye and then brought back to the Operating Room where the left eye was prepped and draped in the usual sterile ophthalmic fashion and a lid speculum was placed. A paracentesis was created with the side port blade and the anterior chamber was filled with viscoelastic. A near clear corneal incision was performed with the steel keratome. A continuous curvilinear capsulorrhexis was performed with a cystotome followed by the capsulorrhexis forceps. Hydrodissection and hydrodelineation were carried out with BSS on a blunt cannula. The lens was removed in a stop and chop  technique and the remaining cortical material was removed with the irrigation-aspiration handpiece. The capsular bag was inflated with viscoelastic and the Technis [id number] lens was placed in the capsular bag without complication. The remaining viscoelastic was removed from the eye with the irrigation-aspiration handpiece. The wounds were hydrated. The anterior chamber was flushed with BSS and the eye was inflated to physiologic pressure. 0.63ml Vigamox was placed in the anterior chamber. The wounds were found to be water tight. The eye was dressed with Combigan. The patient was given protective glasses to wear throughout the day and a shield with which to sleep tonight. The patient was also given drops with which to begin a drop regimen  today and will follow-up with me in one day. Implant Name Type Inv. Item Serial No. Manufacturer Lot No. LRB No. Used Action  LENS IOL TECNIS EYHANCE 22.5 - [id number] Intraocular Lens LENS IOL TECNIS EYHANCE 22.5 [id number] [employer]  Left 1 Implanted    Procedure(s): CATARACT EXTRACTION PHACO AND INTRAOCULAR LENS PLACEMENT (IOC) LEFT (Left)  Electronically signed: Galen Manila 06/26/2023 9:01 AM

## 2023-06-26 NOTE — Anesthesia Postprocedure Evaluation (Signed)
Anesthesia Post Note  Patient: Cindy Mendoza  Procedure(s) Performed: CATARACT EXTRACTION PHACO AND INTRAOCULAR LENS PLACEMENT (IOC) LEFT 11.56 00:51.4 (Left)  Patient location during evaluation: PACU Anesthesia Type: MAC Level of consciousness: awake and alert Pain management: pain level controlled Vital Signs Assessment: post-procedure vital signs reviewed and stable Respiratory status: spontaneous breathing, nonlabored ventilation, respiratory function stable and patient connected to nasal cannula oxygen Cardiovascular status: stable and blood pressure returned to baseline Postop Assessment: no apparent nausea or vomiting Anesthetic complications: no   No notable events documented.   Last Vitals:  Vitals:   06/26/23 0903 06/26/23 0905  BP:  112/71  Pulse: (!) 58 (!) 57  Resp: 15 11  Temp:    SpO2: 100% 100%    Last Pain:  Vitals:   06/26/23 0905  TempSrc:   PainSc: 0-No pain                 Marisue Humble

## 2023-06-26 NOTE — Transfer of Care (Signed)
Immediate Anesthesia Transfer of Care Note  Patient: Cindy Mendoza  Procedure(s) Performed: CATARACT EXTRACTION PHACO AND INTRAOCULAR LENS PLACEMENT (IOC) LEFT (Left)  Patient Location: PACU  Anesthesia Type: MAC  Level of Consciousness: awake, alert  and patient cooperative  Airway and Oxygen Therapy: Patient Spontanous Breathing and Patient connected to supplemental oxygen  Post-op Assessment: Post-op Vital signs reviewed, Patient's Cardiovascular Status Stable, Respiratory Function Stable, Patent Airway and No signs of Nausea or vomiting  Post-op Vital Signs: Reviewed and stable  Complications: No notable events documented.

## 2023-06-28 ENCOUNTER — Encounter: Payer: Self-pay | Admitting: Ophthalmology

## 2023-08-04 ENCOUNTER — Ambulatory Visit
Admission: RE | Admit: 2023-08-04 | Discharge: 2023-08-04 | Disposition: A | Discharge status: Home / Self Care | Payer: Medicare Other | Source: Ambulatory Visit | Attending: Student

## 2023-08-04 DIAGNOSIS — Z1231 Encounter for screening mammogram for malignant neoplasm of breast: Secondary | ICD-10-CM | POA: Diagnosis present

## 2023-11-14 ENCOUNTER — Ambulatory Visit: Payer: Medicare Other | Admitting: Dermatology

## 2023-11-15 ENCOUNTER — Ambulatory Visit: Admitting: Dermatology

## 2023-11-15 DIAGNOSIS — D225 Melanocytic nevi of trunk: Secondary | ICD-10-CM

## 2023-11-15 DIAGNOSIS — Z1283 Encounter for screening for malignant neoplasm of skin: Secondary | ICD-10-CM | POA: Diagnosis not present

## 2023-11-15 DIAGNOSIS — W908XXA Exposure to other nonionizing radiation, initial encounter: Secondary | ICD-10-CM

## 2023-11-15 DIAGNOSIS — Z85820 Personal history of malignant melanoma of skin: Secondary | ICD-10-CM

## 2023-11-15 DIAGNOSIS — L570 Actinic keratosis: Secondary | ICD-10-CM | POA: Diagnosis not present

## 2023-11-15 DIAGNOSIS — L821 Other seborrheic keratosis: Secondary | ICD-10-CM

## 2023-11-15 DIAGNOSIS — L578 Other skin changes due to chronic exposure to nonionizing radiation: Secondary | ICD-10-CM | POA: Diagnosis not present

## 2023-11-15 DIAGNOSIS — L814 Other melanin hyperpigmentation: Secondary | ICD-10-CM

## 2023-11-15 DIAGNOSIS — Z85828 Personal history of other malignant neoplasm of skin: Secondary | ICD-10-CM

## 2023-11-15 DIAGNOSIS — D1801 Hemangioma of skin and subcutaneous tissue: Secondary | ICD-10-CM

## 2023-11-15 DIAGNOSIS — D229 Melanocytic nevi, unspecified: Secondary | ICD-10-CM

## 2023-11-15 NOTE — Patient Instructions (Addendum)

## 2023-11-15 NOTE — Progress Notes (Signed)
 New Patient Visit   Subjective  Cindy Mendoza is a 73 y.o. female who presents for the following: Skin Cancer Screening and Full Body Skin Exam. Patient with hx melanoma on R 2nd toe in 1998 Level II, and several SCC does not remember dates. Patient has not noticed any new places, denies any family history of skin cancers.   The patient presents for Total-Body Skin Exam (TBSE) for skin cancer screening and mole check. The patient has spots, moles and lesions to be evaluated, some may be new or changing and the patient may have concern these could be cancer.   The following portions of the chart were reviewed this encounter and updated as appropriate: medications, allergies, medical history  Review of Systems:  No other skin or systemic complaints except as noted in HPI or Assessment and Plan.  Objective  Well appearing patient in no apparent distress; mood and affect are within normal limits.  A full examination was performed including scalp, head, eyes, ears, nose, lips, neck, chest, axillae, abdomen, back, buttocks, bilateral upper extremities, bilateral lower extremities, hands, feet, fingers, toes, fingernails, and toenails. All findings within normal limits unless otherwise noted below.   Relevant physical exam findings are noted in the Assessment and Plan.  R zygoma x1 Pink scaly macules  Assessment & Plan   SKIN CANCER SCREENING PERFORMED TODAY.  ACTINIC DAMAGE - Chronic condition, secondary to cumulative UV/sun exposure - diffuse scaly erythematous macules with underlying dyspigmentation - Recommend daily broad spectrum sunscreen SPF 30+ to sun-exposed areas, reapply every 2 hours as needed.  - Staying in the shade or wearing long sleeves, sun glasses (UVA+UVB protection) and wide brim hats (4-inch brim around the entire circumference of the hat) are also recommended for sun protection.  - Call for new or changing lesions.  LENTIGINES, SEBORRHEIC KERATOSES, HEMANGIOMAS  - Benign normal skin lesions - Benign-appearing - Call for any changes   MELANOCYTIC NEVI - Tan-brown and/or pink-flesh-colored symmetric macules and papules - 3mm medium brown macule at lower sternum - Benign appearing on exam today - Observation - Call clinic for new or changing moles - Recommend daily use of broad spectrum spf 30+ sunscreen to sun-exposed areas.   HISTORY OF MELANOMA R second toe level II- 1998 - No evidence of recurrence today - Recommend regular full body skin exams - Recommend daily broad spectrum sunscreen SPF 30+ to sun-exposed areas, reapply every 2 hours as needed.  - Call if any new or changing lesions are noted between office visits  HISTORY OF SQUAMOUS CELL CARCINOMA OF THE SKIN - No evidence of recurrence today - Recommend regular full body skin exams - Recommend daily broad spectrum sunscreen SPF 30+ to sun-exposed areas, reapply every 2 hours as needed.  - Call if any new or changing lesions are noted between office visits  AK (ACTINIC KERATOSIS) R zygoma x1 Actinic keratoses are precancerous spots that appear secondary to cumulative UV radiation exposure/sun exposure over time. They are chronic with expected duration over 1 year. A portion of actinic keratoses will progress to squamous cell carcinoma of the skin. It is not possible to reliably predict which spots will progress to skin cancer and so treatment is recommended to prevent development of skin cancer.  Recommend daily broad spectrum sunscreen SPF 30+ to sun-exposed areas, reapply every 2 hours as needed.  Recommend staying in the shade or wearing long sleeves, sun glasses (UVA+UVB protection) and wide brim hats (4-inch brim around the entire circumference of the hat).  Call for new or changing lesions. Destruction of lesion - R zygoma x1  Destruction method: cryotherapy   Informed consent: discussed and consent obtained   Lesion destroyed using liquid nitrogen: Yes   Region frozen until  ice ball extended beyond lesion: Yes   Outcome: patient tolerated procedure well with no complications   Post-procedure details: wound care instructions given   Additional details:  Prior to procedure, discussed risks of blister formation, small wound, skin dyspigmentation, or rare scar following cryotherapy. Recommend Vaseline ointment to treated areas while healing.   Return in 1 year (on 11/14/2024) for Hx Melanoma, Hx SCC, w/ Dr. Annette Barters, TBSE.  I, Jacquelynn Vera, CMA, am acting as scribe for Artemio Larry, MD .   Documentation: I have reviewed the above documentation for accuracy and completeness, and I agree with the above.  Artemio Larry, MD

## 2024-04-22 ENCOUNTER — Other Ambulatory Visit: Payer: Self-pay | Admitting: Student

## 2024-04-22 DIAGNOSIS — Z1231 Encounter for screening mammogram for malignant neoplasm of breast: Secondary | ICD-10-CM

## 2024-08-05 ENCOUNTER — Encounter

## 2024-09-04 ENCOUNTER — Encounter

## 2024-12-09 ENCOUNTER — Encounter: Admitting: Dermatology
